# Patient Record
Sex: Female | Born: 1995 | Race: White | Hispanic: No | Marital: Single | Smoking: Current every day smoker
Health system: Southern US, Community
[De-identification: ages and names within clinical notes are randomized; demographics above are authoritative.]

## PROBLEM LIST (undated history)

## (undated) DIAGNOSIS — R519 Headache, unspecified: Secondary | ICD-10-CM

## (undated) DIAGNOSIS — J039 Acute tonsillitis, unspecified: Secondary | ICD-10-CM

## (undated) DIAGNOSIS — M549 Dorsalgia, unspecified: Secondary | ICD-10-CM

## (undated) DIAGNOSIS — G8929 Other chronic pain: Secondary | ICD-10-CM

## (undated) DIAGNOSIS — R51 Headache: Secondary | ICD-10-CM

## (undated) HISTORY — PX: WISDOM TOOTH EXTRACTION: SHX21

---
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
## 2015-07-14 ENCOUNTER — Encounter (HOSPITAL_COMMUNITY): Payer: Self-pay | Admitting: *Deleted

## 2015-07-14 ENCOUNTER — Emergency Department (HOSPITAL_COMMUNITY)
Admission: EM | Admit: 2015-07-14 | Discharge: 2015-07-14 | Disposition: A | Discharge status: Home / Self Care | Payer: Medicaid Other | Attending: Emergency Medicine | Admitting: Emergency Medicine

## 2015-07-14 DIAGNOSIS — Z79899 Other long term (current) drug therapy: Secondary | ICD-10-CM | POA: Diagnosis not present

## 2015-07-14 DIAGNOSIS — Z791 Long term (current) use of non-steroidal anti-inflammatories (NSAID): Secondary | ICD-10-CM | POA: Insufficient documentation

## 2015-07-14 DIAGNOSIS — G8929 Other chronic pain: Secondary | ICD-10-CM | POA: Insufficient documentation

## 2015-07-14 DIAGNOSIS — N939 Abnormal uterine and vaginal bleeding, unspecified: Secondary | ICD-10-CM | POA: Diagnosis present

## 2015-07-14 DIAGNOSIS — Z3202 Encounter for pregnancy test, result negative: Secondary | ICD-10-CM | POA: Diagnosis not present

## 2015-07-14 DIAGNOSIS — R350 Frequency of micturition: Secondary | ICD-10-CM | POA: Diagnosis not present

## 2015-07-14 LAB — COMPREHENSIVE METABOLIC PANEL
ALBUMIN: 3.7 g/dL (ref 3.5–5.0)
ALT: 18 U/L (ref 14–54)
AST: 21 U/L (ref 15–41)
Alkaline Phosphatase: 55 U/L (ref 38–126)
Anion gap: 13 (ref 5–15)
BUN: 10 mg/dL (ref 6–20)
CALCIUM: 9.5 mg/dL (ref 8.9–10.3)
CHLORIDE: 106 mmol/L (ref 101–111)
CO2: 22 mmol/L (ref 22–32)
Creatinine, Ser: 0.68 mg/dL (ref 0.44–1.00)
GFR calc Af Amer: >60 mL/min (ref >60)
GFR calc non Af Amer: >60 mL/min (ref >60)
GLUCOSE: 111 mg/dL — AB (ref 65–99)
POTASSIUM: 3.9 mmol/L (ref 3.5–5.1)
SODIUM: 141 mmol/L (ref 135–145)
TOTAL PROTEIN: 6.8 g/dL (ref 6.5–8.1)
Total Bilirubin: 0.7 mg/dL (ref 0.3–1.2)

## 2015-07-14 LAB — CBC WITH DIFFERENTIAL/PLATELET
BASOS ABS: 0 10*3/uL (ref 0.0–0.1)
BASOS PCT: 0 %
Eosinophils Absolute: 0.2 10*3/uL (ref 0.0–0.7)
Eosinophils Relative: 4 %
HEMATOCRIT: 41.4 % (ref 36.0–46.0)
HEMOGLOBIN: 13.5 g/dL (ref 12.0–15.0)
Lymphocytes Relative: 31 %
Lymphs Abs: 1.6 10*3/uL (ref 0.7–4.0)
MCH: 28 pg (ref 26.0–34.0)
MCHC: 32.6 g/dL (ref 30.0–36.0)
MCV: 85.9 fL (ref 78.0–100.0)
MONO ABS: 0.5 10*3/uL (ref 0.1–1.0)
Monocytes Relative: 10 %
NEUTROS ABS: 2.9 10*3/uL (ref 1.7–7.7)
NEUTROS PCT: 55 %
Platelets: 250 10*3/uL (ref 150–400)
RBC: 4.82 MIL/uL (ref 3.87–5.11)
RDW: 12.6 % (ref 11.5–15.5)
WBC: 5.3 10*3/uL (ref 4.0–10.5)

## 2015-07-14 LAB — HCG, QUANTITATIVE, PREGNANCY

## 2015-07-14 NOTE — ED Notes (Signed)
Pt unable to void at this time. 

## 2015-07-14 NOTE — ED Notes (Signed)
Pt presents via POV c/o vaginal bleeding.  Pt reports + pregnancy test Friday, LMP 4/1.  Reports very light bleeding.  A x 4, NAD.

## 2015-07-14 NOTE — ED Notes (Signed)
Pt ambulated to restroom in room. 

## 2015-07-14 NOTE — ED Provider Notes (Signed)
CSN: [id number]     Arrival date & time 07/14/15  4098 History   First MD Initiated Contact with Patient 07/14/15 [phone]     Chief Complaint  Patient presents with  . Vaginal Bleeding   HPI Comments: 20 year old G0P0 female who presents with vaginal bleeding since this morning. She states that she took a pregnancy test on Friday which was positive. LMP was beginning of April. She has never been pregnant before. She had only one episode of vaginal bleeding which she states was "light". She reports associated intermittent lower abdominal cramping and urinary frequency. Denies fever, chest pain, SOB, N/V/D, dysuria, vaginal discharge.  Patient is a 20 y.o. female presenting with vaginal bleeding.  Vaginal Bleeding Associated symptoms: abdominal pain   Associated symptoms: no dysuria, no fever, no nausea and no vaginal discharge     Past Medical History  Diagnosis Date  . Chronic headaches    History reviewed. No pertinent past surgical history. No family history on file. Social History  Substance Use Topics  . Smoking status: Never Smoker   . Smokeless tobacco: None  . Alcohol Use: No   OB History    No data available     Review of Systems  Constitutional: Negative for fever.  Gastrointestinal: Positive for abdominal pain. Negative for nausea, vomiting and diarrhea.  Genitourinary: Positive for frequency and vaginal bleeding. Negative for dysuria and vaginal discharge.  All other systems reviewed and are negative.   Allergies  Review of patient's allergies indicates no known allergies.  Home Medications   Prior to Admission medications   Medication Sig Start Date End Date Taking? Authorizing Provider  ibuprofen (ADVIL,MOTRIN) 800 MG tablet Take 1 tablet (800 mg total) by mouth 3 (three) times daily. 07/22/13   Graylon Good, PA-C  topiramate (TOPAMAX) 100 MG tablet Take 200 mg by mouth at bedtime.     Historical Provider, MD  traMADol (ULTRAM) 50 MG tablet Take 1 tablet (50  mg total) by mouth every 6 (six) hours as needed. 07/22/13   Adrian Blackwater Baker, PA-C   BP 113/60 mmHg  Pulse 85  Temp(Src) 98.9 F (37.2 C) (Oral)  Resp 18  SpO2 100%  LMP 05/29/2015   Physical Exam  Constitutional: She is oriented to person, place, and time. She appears well-developed and well-nourished. No distress.  HENT:  Head: Normocephalic and atraumatic.  Eyes: Conjunctivae are normal. Pupils are equal, round, and reactive to light. Right eye exhibits no discharge. Left eye exhibits no discharge. No scleral icterus.  Neck: Normal range of motion.  Cardiovascular: Normal rate and regular rhythm.  Exam reveals no gallop and no friction rub.   No murmur heard. Pulmonary/Chest: Effort normal and breath sounds normal. No respiratory distress. She has no wheezes. She has no rales. She exhibits no tenderness.  Abdominal: Soft. Bowel sounds are normal. She exhibits no distension and no mass. There is no tenderness. There is no rebound and no guarding.  Genitourinary:  Chaperone present during exam. No inguinal lymphadenopathy or inguinal hernia noted. Normal external genitalia. No pain with speculum insertion. Close cervical os with out any significant discharge noted. Moderate amount of blood in the vaginal vault and coming from cervix. Os with normal appearance without rash or lesions. On bimanual examination no adnexal tenderness or cervical motion tenderness.    Neurological: She is alert and oriented to person, place, and time.  Skin: Skin is warm and dry.  Psychiatric: She has a normal mood and affect.  ED Course  Procedures (including critical care time) Labs Review Labs Reviewed  COMPREHENSIVE METABOLIC PANEL - Abnormal; Notable for the following:    Glucose, Bld 111 (*)    All other components within normal limits  HCG, QUANTITATIVE, PREGNANCY  CBC WITH DIFFERENTIAL/PLATELET    Imaging Review No results found. I have personally reviewed and evaluated these images and  lab results as part of my medical decision-making.   EKG Interpretation None      MDM   Final diagnoses:  Vaginal bleeding   20 year old female who presents with vaginal bleeding in the setting of a positive home pregnancy test.  All vital signs were within normal limits and stable on the ER.  Physical exam is remarkable for a moderate amount of blood in the vaginal vault on pelvic exam. Abdominal exam was benign. CBC is unremarkable. CMP is unremarkable. Quantitative, pregnancy test is negative.  Patient was able to void while in the emergency room.  Strict return precautions given for fever, UTI, worsening abdominal pain.  Patient is NAD, non-toxic, with stable VS. Patient is informed of clinical course, understands medical decision making process, and agrees with plan. Opportunity for questions provided and all questions answered. Return precautions given.   Bethel BornKelly Marie Karnell Vanderloop, PA-C 07/14/15 1039  Melene Planan Floyd, DO 07/14/15 1044

## 2015-07-14 NOTE — ED Notes (Signed)
Pelvic cart set up at bedside  

## 2015-07-14 NOTE — Discharge Instructions (Signed)
You were seen and evaluated today for vaginal bleeding in the setting of a positive pregnancy test at home. Your pregnancy test here in the Emergency room was negative so it is most likely you are having your regular period. You were also reporting some urinary frequency but were unable to give a sample and elected to be discharged before providing the sample. If you start to develop a fever, symptoms of a UTI (burning when you pee, urine with an odor, peeing frequently) or worsening pain in your abdomen, please return to the ER for reevaluation.

## 2015-11-13 ENCOUNTER — Encounter (HOSPITAL_COMMUNITY): Payer: Self-pay | Admitting: Emergency Medicine

## 2015-11-13 ENCOUNTER — Emergency Department (HOSPITAL_COMMUNITY): Payer: Medicaid Other

## 2015-11-13 ENCOUNTER — Emergency Department (HOSPITAL_COMMUNITY)
Admission: EM | Admit: 2015-11-13 | Discharge: 2015-11-13 | Disposition: A | Discharge status: Home / Self Care | Payer: Medicaid Other | Attending: Emergency Medicine | Admitting: Emergency Medicine

## 2015-11-13 DIAGNOSIS — Y999 Unspecified external cause status: Secondary | ICD-10-CM | POA: Insufficient documentation

## 2015-11-13 DIAGNOSIS — Y939 Activity, unspecified: Secondary | ICD-10-CM | POA: Diagnosis not present

## 2015-11-13 DIAGNOSIS — X501XXA Overexertion from prolonged static or awkward postures, initial encounter: Secondary | ICD-10-CM | POA: Diagnosis not present

## 2015-11-13 DIAGNOSIS — Y929 Unspecified place or not applicable: Secondary | ICD-10-CM | POA: Diagnosis not present

## 2015-11-13 DIAGNOSIS — R52 Pain, unspecified: Secondary | ICD-10-CM

## 2015-11-13 DIAGNOSIS — S93602A Unspecified sprain of left foot, initial encounter: Secondary | ICD-10-CM

## 2015-11-13 DIAGNOSIS — S9032XA Contusion of left foot, initial encounter: Secondary | ICD-10-CM | POA: Insufficient documentation

## 2015-11-13 DIAGNOSIS — S93402A Sprain of unspecified ligament of left ankle, initial encounter: Secondary | ICD-10-CM | POA: Insufficient documentation

## 2015-11-13 DIAGNOSIS — S99912A Unspecified injury of left ankle, initial encounter: Secondary | ICD-10-CM | POA: Diagnosis present

## 2015-11-13 MED ORDER — IBUPROFEN 800 MG PO TABS
800.0000 mg | ORAL_TABLET | Freq: Three times a day (TID) | ORAL | 0 refills | Status: DC
Start: 2015-11-13 — End: 2016-07-18

## 2015-11-13 MED ORDER — IBUPROFEN 400 MG PO TABS
ORAL_TABLET | ORAL | Status: AC
  Filled 2015-11-13: qty 1

## 2015-11-13 MED ORDER — IBUPROFEN 400 MG PO TABS
400.0000 mg | ORAL_TABLET | Freq: Once | ORAL | Status: AC
  Administered 2015-11-13: 400 mg via ORAL

## 2015-11-13 NOTE — ED Triage Notes (Signed)
Pt arrives with c/o L sided ankle pain after "stepping in a hole." Able to ambulate a bit. Denies fall. CMS intact, no swelling, no deformity. Chronic swelling to R foot.

## 2015-11-13 NOTE — Discharge Instructions (Signed)

## 2015-11-13 NOTE — ED Provider Notes (Signed)
[facility]-EMERGENCY DEPT Provider Note   CSN: [id number] Arrival date & time: 11/13/15  0038     History   Chief Complaint Chief Complaint  Patient presents with  . Ankle Pain    HPI Brenda Hale is a 20 y.o. female with a hx of Chronic headaches presents to the Emergency Department complaining of acute, persistent left ankle pain after stumbling into a hole and twisting her foot and ankle. Patient reports this occurred approximately 6 PM tonight. She did not fall or hit her head. No loss of consciousness. She reports at this time her pain has been burning in nature and persistent. She reports she has been able to weight-bear. No treatments prior to arrival. She was given Motrin in the waiting room which did help. No numbness or tingling, no weakness. No previous surgeries to the foot or ankle.    The history is provided by the patient and medical records. No language interpreter was used.    Past Medical History:  Diagnosis Date  . Chronic headaches     There are no active problems to display for this patient.   History reviewed. No pertinent surgical history.  OB History    No data available       Home Medications    Prior to Admission medications   Medication Sig Start Date End Date Taking? Authorizing Provider  acetaminophen (TYLENOL) 325 MG tablet Take 650 mg by mouth every 6 (six) hours as needed for mild pain.    Historical Provider, MD  ibuprofen (ADVIL,MOTRIN) 800 MG tablet Take 1 tablet (800 mg total) by mouth 3 (three) times daily. Patient not taking: Reported on 07/14/2015 07/22/13   Graylon GoodZachary H Baker, PA-C  traMADol (ULTRAM) 50 MG tablet Take 1 tablet (50 mg total) by mouth every 6 (six) hours as needed. Patient not taking: Reported on 07/14/2015 07/22/13   Graylon GoodZachary H Baker, PA-C    Family History No family history on file.  Social History Social History  Substance Use Topics  . Smoking status: Never Smoker  . Smokeless tobacco: Never Used  . Alcohol  use Yes     Allergies   Review of patient's allergies indicates no known allergies.   Review of Systems Review of Systems  Musculoskeletal: Positive for arthralgias and joint swelling.  All other systems reviewed and are negative.    Physical Exam Updated Vital Signs BP 107/56   Pulse 60   Temp 98.3 F (36.8 C) (Oral)   Resp 16   Ht 4\' 10"  (1.473 m)   Wt 63.5 kg   LMP 11/12/2015 (Exact Date)   SpO2 99%   BMI 29.26 kg/m   Physical Exam  Constitutional: She appears well-developed and well-nourished. No distress.  HENT:  Head: Normocephalic and atraumatic.  Eyes: Conjunctivae are normal.  Neck: Normal range of motion.  Cardiovascular: Normal rate, regular rhythm and intact distal pulses.   Capillary refill < 3 sec  Pulmonary/Chest: Effort normal and breath sounds normal.  Musculoskeletal: She exhibits tenderness. She exhibits no edema.  Left ankle: Full range of motion, mild tenderness to palpation of the lateral malleolus, no swelling or ecchymosis noted  Left foot: Full range of motion of all toes, sensation intact, strength 5/5 with dorsiflexion and plantar flexion. Tenderness to palpation over the fifth metatarsal with some swelling and ecchymosis.  No open wounds.  Neurological: She is alert. Coordination normal.  Skin: Skin is warm and dry. She is not diaphoretic.  No tenting of the skin  Psychiatric: She  has a normal mood and affect.  Nursing note and vitals reviewed.    ED Treatments / Results  Labs (all labs ordered are listed, but only abnormal results are displayed) Labs Reviewed - No data to display  EKG  EKG Interpretation None       Radiology Dg Ankle Complete Left  Result Date: 11/13/2015 CLINICAL DATA:  Stepped in hole, with left lateral ankle and foot pain. Initial encounter. EXAM: LEFT ANKLE COMPLETE - 3+ VIEW COMPARISON:  Left ankle radiographs performed 05/10/2008 FINDINGS: There is no evidence of fracture or dislocation. The ankle  mortise is intact; the interosseous space is within normal limits. No talar tilt or subluxation is seen. The joint spaces are preserved. No significant soft tissue abnormalities are seen. IMPRESSION: No evidence of fracture or dislocation. Electronically Signed   By: Roanna Raider M.D.   On: 11/13/2015 01:48   Dg Foot Complete Left  Result Date: 11/13/2015 CLINICAL DATA:  Stepped in hole, with left lateral ankle and foot pain. Initial encounter. EXAM: LEFT FOOT - COMPLETE 3+ VIEW COMPARISON:  Left ankle radiographs performed 05/10/2008 FINDINGS: There is no evidence of fracture or dislocation. The joint spaces are preserved. There is no evidence of talar subluxation; the subtalar joint is unremarkable in appearance. A bipartite medial sesamoid of the first toe is noted. No significant soft tissue abnormalities are seen. IMPRESSION: 1. No evidence of fracture or dislocation. 2. Bipartite medial sesamoid of the first toe. Electronically Signed   By: Roanna Raider M.D.   On: 11/13/2015 01:50    Procedures Procedures (including critical care time)  Medications Ordered in ED Medications  ibuprofen (ADVIL,MOTRIN) 400 MG tablet (not administered)  ibuprofen (ADVIL,MOTRIN) tablet 400 mg (400 mg Oral Given 11/13/15 0059)     Initial Impression / Assessment and Plan / ED Course  I have reviewed the triage vital signs and the nursing notes.  Pertinent labs & imaging results that were available during my care of the patient were reviewed by me and considered in my medical decision making (see chart for details).  Clinical Course  Value Comment By Time  DG Foot Complete Left No acute abnormalities Dierdre Forth, PA-C 09/16 0529  DG Ankle Complete Left No acute abnormalities Dierdre Forth, PA-C 09/16 0530  BP: 107/56 VSS Dierdre Forth, PA-C 09/16 0530    Patient X-Ray negative for obvious fracture or dislocation. Pain managed in ED. Pt advised to follow up with orthopedics if  symptoms persist for possibility of missed fracture diagnosis. Patient given brace while in ED, conservative therapy recommended and discussed. Patient will be dc home & is agreeable with above plan.  Final Clinical Impressions(s) / ED Diagnoses   Final diagnoses:  Pain    New Prescriptions New Prescriptions   No medications on file     Dierdre Forth, PA-C 11/13/15 1610    April Palumbo, MD 11/13/15 [phone]

## 2016-03-15 ENCOUNTER — Encounter (HOSPITAL_COMMUNITY): Payer: Self-pay | Admitting: *Deleted

## 2016-03-15 ENCOUNTER — Emergency Department (HOSPITAL_COMMUNITY): Payer: Medicaid Other

## 2016-03-15 ENCOUNTER — Inpatient Hospital Stay (HOSPITAL_COMMUNITY)
Admission: EM | Admit: 2016-03-15 | Discharge: 2016-03-17 | DRG: 863 | Disposition: A | Discharge status: Home Health | Payer: Medicaid Other | Attending: Internal Medicine | Admitting: Internal Medicine

## 2016-03-15 DIAGNOSIS — F1010 Alcohol abuse, uncomplicated: Secondary | ICD-10-CM | POA: Diagnosis present

## 2016-03-15 DIAGNOSIS — Y838 Other surgical procedures as the cause of abnormal reaction of the patient, or of later complication, without mention of misadventure at the time of the procedure: Secondary | ICD-10-CM | POA: Diagnosis present

## 2016-03-15 DIAGNOSIS — M272 Inflammatory conditions of jaws: Secondary | ICD-10-CM | POA: Diagnosis present

## 2016-03-15 DIAGNOSIS — F17210 Nicotine dependence, cigarettes, uncomplicated: Secondary | ICD-10-CM | POA: Diagnosis present

## 2016-03-15 DIAGNOSIS — R1310 Dysphagia, unspecified: Secondary | ICD-10-CM | POA: Diagnosis present

## 2016-03-15 DIAGNOSIS — Z720 Tobacco use: Secondary | ICD-10-CM | POA: Diagnosis present

## 2016-03-15 DIAGNOSIS — T814XXA Infection following a procedure, initial encounter: Principal | ICD-10-CM | POA: Diagnosis present

## 2016-03-15 LAB — BASIC METABOLIC PANEL
ANION GAP: 11 (ref 5–15)
BUN: 6 mg/dL (ref 6–20)
CHLORIDE: 106 mmol/L (ref 101–111)
CO2: 21 mmol/L — AB (ref 22–32)
CREATININE: 0.82 mg/dL (ref 0.44–1.00)
Calcium: 9.1 mg/dL (ref 8.9–10.3)
GFR calc Af Amer: >60 mL/min (ref >60)
GFR calc non Af Amer: >60 mL/min (ref >60)
Glucose, Bld: 112 mg/dL — ABNORMAL HIGH (ref 65–99)
POTASSIUM: 3.3 mmol/L — AB (ref 3.5–5.1)
Sodium: 138 mmol/L (ref 135–145)

## 2016-03-15 LAB — CBC WITH DIFFERENTIAL/PLATELET
BASOS PCT: 0 %
Basophils Absolute: 0 10*3/uL (ref 0.0–0.1)
Eosinophils Absolute: 0 10*3/uL (ref 0.0–0.7)
Eosinophils Relative: 0 %
HEMATOCRIT: 36 % (ref 36.0–46.0)
HEMOGLOBIN: 11.8 g/dL — AB (ref 12.0–15.0)
LYMPHS ABS: 1.3 10*3/uL (ref 0.7–4.0)
Lymphocytes Relative: 13 %
MCH: 27.6 pg (ref 26.0–34.0)
MCHC: 32.8 g/dL (ref 30.0–36.0)
MCV: 84.3 fL (ref 78.0–100.0)
MONOS PCT: 7 %
Monocytes Absolute: 0.7 10*3/uL (ref 0.1–1.0)
NEUTROS ABS: 7.9 10*3/uL — AB (ref 1.7–7.7)
NEUTROS PCT: 80 %
Platelets: 208 10*3/uL (ref 150–400)
RBC: 4.27 MIL/uL (ref 3.87–5.11)
RDW: 13.1 % (ref 11.5–15.5)
WBC: 10 10*3/uL (ref 4.0–10.5)

## 2016-03-15 LAB — I-STAT BETA HCG BLOOD, ED (MC, WL, AP ONLY): I-stat hCG, quantitative: <5 m[IU]/mL (ref <5)

## 2016-03-15 LAB — I-STAT CG4 LACTIC ACID, ED: LACTIC ACID, VENOUS: 1.12 mmol/L (ref 0.5–1.9)

## 2016-03-15 MED ORDER — ACETAMINOPHEN 500 MG PO TABS
1000.0000 mg | ORAL_TABLET | Freq: Once | ORAL | Status: AC
  Administered 2016-03-15: 1000 mg via ORAL
  Filled 2016-03-15: qty 2

## 2016-03-15 MED ORDER — ENOXAPARIN SODIUM 40 MG/0.4ML ~~LOC~~ SOLN
40.0000 mg | SUBCUTANEOUS | Status: DC
  Administered 2016-03-15 – 2016-03-16 (×2): 40 mg via SUBCUTANEOUS
  Filled 2016-03-15 (×3): qty 0.4

## 2016-03-15 MED ORDER — ONDANSETRON HCL 4 MG PO TABS: 4.0000 mg | ORAL_TABLET | Freq: Four times a day (QID) | ORAL | Status: DC | PRN

## 2016-03-15 MED ORDER — IOPAMIDOL (ISOVUE-300) INJECTION 61%
INTRAVENOUS | Status: AC
  Administered 2016-03-15: 75 mL via INTRAVENOUS
  Filled 2016-03-15: qty 75

## 2016-03-15 MED ORDER — DEXAMETHASONE SODIUM PHOSPHATE 10 MG/ML IJ SOLN
10.0000 mg | Freq: Four times a day (QID) | INTRAMUSCULAR | Status: DC
  Administered 2016-03-15 – 2016-03-17 (×9): 10 mg via INTRAVENOUS
  Filled 2016-03-15 (×9): qty 1

## 2016-03-15 MED ORDER — ONDANSETRON HCL 4 MG/2ML IJ SOLN: 4.0000 mg | Freq: Four times a day (QID) | INTRAMUSCULAR | Status: DC | PRN

## 2016-03-15 MED ORDER — FENTANYL CITRATE (PF) 100 MCG/2ML IJ SOLN
50.0000 ug | Freq: Once | INTRAMUSCULAR | Status: AC
  Administered 2016-03-15: 50 ug via INTRAVENOUS
  Filled 2016-03-15: qty 2

## 2016-03-15 MED ORDER — SODIUM CHLORIDE 0.9 % IV BOLUS (SEPSIS)
1000.0000 mL | Freq: Once | INTRAVENOUS | Status: AC
  Administered 2016-03-15: 1000 mL via INTRAVENOUS

## 2016-03-15 MED ORDER — CLINDAMYCIN PHOSPHATE 900 MG/50ML IV SOLN
900.0000 mg | Freq: Three times a day (TID) | INTRAVENOUS | Status: DC
  Administered 2016-03-15 – 2016-03-17 (×6): 900 mg via INTRAVENOUS
  Filled 2016-03-15 (×7): qty 50

## 2016-03-15 MED ORDER — SODIUM CHLORIDE 0.9 % IV SOLN
3.0000 g | Freq: Three times a day (TID) | INTRAVENOUS | Status: DC
  Administered 2016-03-15: 3 g via INTRAVENOUS
  Filled 2016-03-15 (×2): qty 3

## 2016-03-15 MED ORDER — KETOROLAC TROMETHAMINE 30 MG/ML IJ SOLN
30.0000 mg | Freq: Four times a day (QID) | INTRAMUSCULAR | Status: DC | PRN
  Administered 2016-03-15 – 2016-03-17 (×5): 30 mg via INTRAVENOUS
  Filled 2016-03-15 (×6): qty 1

## 2016-03-15 MED ORDER — SODIUM CHLORIDE 0.9 % IV SOLN
INTRAVENOUS | Status: AC
  Administered 2016-03-15: 09:00:00 via INTRAVENOUS

## 2016-03-15 MED ORDER — MORPHINE SULFATE (PF) 4 MG/ML IV SOLN
2.0000 mg | Freq: Once | INTRAVENOUS | Status: AC
  Administered 2016-03-15: 2 mg via INTRAVENOUS
  Filled 2016-03-15: qty 1

## 2016-03-15 MED ORDER — CLINDAMYCIN PHOSPHATE 900 MG/50ML IV SOLN
900.0000 mg | Freq: Once | INTRAVENOUS | Status: AC
  Administered 2016-03-15: 900 mg via INTRAVENOUS
  Filled 2016-03-15: qty 50

## 2016-03-15 MED ORDER — ONDANSETRON HCL 4 MG/2ML IJ SOLN
4.0000 mg | Freq: Once | INTRAMUSCULAR | Status: AC
Start: 2016-03-15 — End: 2016-03-15
  Administered 2016-03-15: 4 mg via INTRAVENOUS
  Filled 2016-03-15: qty 2

## 2016-03-15 NOTE — Consult Note (Signed)
Reason for Consult:left tooth extraction Referring Physician: er  Brenda Hale is an 21 y.o. female.  HPI: patient with recent wisdom tooth extraction about 4 days ago. She states it's hurting about the same as it did right after the surgery. This was performed at an outside hospital. She has had some increased swelling in the left neck. She has slight amount of trismus. She is not having any stridor but feels like there is a feeling of shortness of breath. She was placed on clindamycin as an outpatient.  Past Medical History:  Diagnosis Date  . Chronic headaches     Past Surgical History:  Procedure Laterality Date  . WISDOM TOOTH EXTRACTION      History reviewed. No pertinent family history.  Social History:  reports that she has never smoked. She has never used smokeless tobacco. She reports that she drinks alcohol. She reports that she uses drugs, including Marijuana.  Allergies: No Known Allergies  Medications: I have reviewed the patient's current medications.  Results for orders placed or performed during the hospital encounter of 03/15/16 (from the past 48 hour(s))  CBC with Differential/Platelet     Status: Abnormal   Collection Time: 03/15/16  5:18 AM  Result Value Ref Range   WBC 10.0 4.0 - 10.5 K/uL   RBC 4.27 3.87 - 5.11 MIL/uL   Hemoglobin 11.8 (L) 12.0 - 15.0 g/dL   HCT 36.0 36.0 - 46.0 %   MCV 84.3 78.0 - 100.0 fL   MCH 27.6 26.0 - 34.0 pg   MCHC 32.8 30.0 - 36.0 g/dL   RDW 13.1 11.5 - 15.5 %   Platelets 208 150 - 400 K/uL   Neutrophils Relative % 80 %   Neutro Abs 7.9 (H) 1.7 - 7.7 K/uL   Lymphocytes Relative 13 %   Lymphs Abs 1.3 0.7 - 4.0 K/uL   Monocytes Relative 7 %   Monocytes Absolute 0.7 0.1 - 1.0 K/uL   Eosinophils Relative 0 %   Eosinophils Absolute 0.0 0.0 - 0.7 K/uL   Basophils Relative 0 %   Basophils Absolute 0.0 0.0 - 0.1 K/uL  Basic metabolic panel     Status: Abnormal   Collection Time: 03/15/16  5:18 AM  Result Value Ref Range    Sodium 138 135 - 145 mmol/L   Potassium 3.3 (L) 3.5 - 5.1 mmol/L   Chloride 106 101 - 111 mmol/L   CO2 21 (L) 22 - 32 mmol/L   Glucose, Bld 112 (H) 65 - 99 mg/dL   BUN 6 6 - 20 mg/dL   Creatinine, Ser 0.82 0.44 - 1.00 mg/dL   Calcium 9.1 8.9 - 10.3 mg/dL   GFR calc non Af Amer >60 >60 mL/min   GFR calc Af Amer >60 >60 mL/min    Comment: (NOTE) The eGFR has been calculated using the CKD EPI equation. This calculation has not been validated in all clinical situations. eGFR's persistently <60 mL/min signify possible Chronic Kidney Disease.    Anion gap 11 5 - 15  I-Stat beta hCG blood, ED ([facility], [facility], [facility] only)     Status: None   Collection Time: 03/15/16  5:25 AM  Result Value Ref Range   I-stat hCG, quantitative <5.0 <5 mIU/mL   Comment 3            Comment:   GEST. AGE      CONC.  (mIU/mL)   <=1 WEEK        5 - 50  2 WEEKS       50 - 500     3 WEEKS       100 - 10,000     4 WEEKS     1,000 - 30,000        FEMALE AND NON-PREGNANT FEMALE:     LESS THAN 5 mIU/mL   I-Stat CG4 Lactic Acid, ED     Status: None   Collection Time: 03/15/16  5:28 AM  Result Value Ref Range   Lactic Acid, Venous 1.12 0.5 - 1.9 mmol/L    Ct Soft Tissue Neck W Contrast  Result Date: 03/15/2016 CLINICAL DATA:  21 year old female status post wisdom tooth extraction 4 days ago with left face and neck swelling. Initial encounter. EXAM: CT NECK WITH CONTRAST TECHNIQUE: Multidetector CT imaging of the neck was performed using the standard protocol following the bolus administration of intravenous contrast. CONTRAST:  62m ISOVUE-300 IOPAMIDOL (ISOVUE-300) INJECTION 61% COMPARISON:  Neck CT 07/10/2005. FINDINGS: Pharynx and larynx: Negative larynx except for left supraglottic laryngeal wall edema mildly affecting the left aryepiglottic fold. There is also mild epiglottic thickening. Symmetric appearing adenoid hypertrophy. Palatine tonsillar enlargement, greater on the left. No tonsillar abscess. Lingual tonsillar  hypertrophy. Generalized left parapharyngeal space inflammation with edema tracking along the left lateral wall of the pharynx. No retropharyngeal effusion or edema. Negative right parapharyngeal space. Salivary glands: Generalized inflammation in the left submandibular space, although both submandibular glands are diminutive or absent. Soft tissue stranding and edema mildly involves the posterolateral aspect of the left sublingual space. The central sublingual space remains normal. The inflammatory changes cross midline in the submental region and there is left greater than right thickening of the platysma and perimandibular inflammatory stranding. Reactive lymphadenopathy is described below. The left masticator space also appears inflamed. There is no organized or drainable fluid collection. No sialolithiasis identified. Both parotid glands appear relatively spared. Thyroid: Negative. Lymph nodes: Reactive appearing left greater than right level 1 and level 2 lymphadenopathy. Mild asymmetric left lymph node hyper enhancement. No cystic or necrotic nodes. Vascular: Major vascular structures in the neck and at the skullbase are patent including the left internal jugular vein. Limited intracranial: Negative. Visualized orbits: Negative. Mastoids and visualized paranasal sinuses: Trace bilateral maxillary sinus mucosal thickening. Other visible paranasal sinuses, tympanic cavities, and mastoids are clear. Skeleton: Gas and fluid within the left mandible wisdom tooth extraction site (sagittal image 68). No associated mandible fracture. No mandible bone erosion to suggest osteomyelitis. The remaining wisdom teeth remain in place. No other acute dental finding. Other osseous structures are normal. Upper chest: Negative lung apices. Negative visualized superior mediastinum. No axillary lymphadenopathy. IMPRESSION: 1. Acute trans spatial inflammation with epicenter at the left submandibular and masticator spaces near the  left mandible wisdom tooth extraction site which contains a small volume of fluid and gas. 2. Trans-spatial edema and inflammation in the deep neck, especially the left parapharyngeal space and left lateral walls of the pharynx and larynx. Mild involvement also of the left sublingual space. 3. However, there is no abscess or drainable fluid collection. 4. Left greater than right reactive level 1 and 2 lymphadenopathy with no suppurative nodes. 5. Both submandibular glands appear chronically diminutive or absent. Electronically Signed   By: HGenevie AnnM.D.   On: 03/15/2016 07:26    ROS Blood pressure 111/69, pulse 116, temperature 101.1 F (38.4 C), temperature source Oral, resp. rate 23, SpO2 98 %. Physical Exam  Constitutional: She appears well-developed and well-nourished.  HENT:  Head: Normocephalic.  Oc/op- there is trismus but can see the uvula which looks normal. There is no tongue swelling and good movement of the tongue. The floor of mouth is slightly edematous but not hard. The left neck is firm and painful to palpation with swelling. No stridor. No change in voice.   Eyes: Conjunctivae are normal. Pupils are equal, round, and reactive to light.    Assessment/Plan: Left neck/parapharyngeal space infection-this undoubtably is secondary to the extraction site. She has a very early evidence of Ludwig angina with very slight swelling in the floor mouth which is not hard and there is no change in the tongue appearance or elevation. Given this she definitely needs intravenous antibiotics and very careful observation. Right now there does not appear to be any abscess that needs to be drained but if she does not respond quickly then she may need a intervention into the submandibular and masseter space. She needs an oral surgeon because this is an oral surgery problem with an oral surgery procedure. She will be admitted by the medicine service for in-hospital care and hopefully a consultation can be  obtained with oral surgery. If she's not responding to medical therapy, the neck issue can be and will be addressed by me.  Brenda Hale 03/15/2016, 12:40 PM

## 2016-03-15 NOTE — H&P (Signed)
Date: 03/15/2016               Patient Name:  Brenda Hale MRN: [id number]  DOB: 1996/02/06 Age / Sex: 21 y.o., female   PCP: Pcp Not In System         Medical Service: Internal Medicine Teaching Service         Attending Physician: Dr. Inez Catalina, MD    First Contact: Dr. Vincente Liberty Pager: [phone]  Second Contact: Dr. 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 4 days ago, she underwent left wisdom tooth extraction at the [facility]. Since then, she developed throat swelling, difficulty breathing,  difficulty swallowing mostly solids but some liquids and subsequent vomiting up of food with some pus and mucous. Associated with her poor oral intake is nausea, some left-sided abdominal cramping, dizziness, and weakness, and last night she developed fever and chills, so she presented to the emergency department for further evaluation. She has undergone prior dental work in the past without any complications and was hospitalized in the 2nd or 3rd grade for a week for "swollen lymph nodes." She has no known allergies to any antibiotics or any other regular medications for any chronic medical conditions. Her family history is without heart or lung disease. Outside of her occupation, she does smoke a pack every 2 weeks for "a few years," drinks 3 bottles of [employer] hard liquor on occasion, and smokes marijuana occasionally. Otherwise, she denies any chest pain, headache, syncope, changes in bowel movements, rash, dysuria, increased urinary frequency.  In the emergency department, CT neck with contrast was notable for acute inflammatory changes around the epicenter of the left  submandibular and masticator spaces near the left mandible wisdom tooth extraction site; inflammatory changes in the deep neck, specifically the left parapharyngeal space and left lateral walls of the pharynx and larynx; reactive lymphadenopathy; but no abscess or drainable fluid collection. She was given 1 L normal saline bolus, fentanyl 500 g, morphine 2 mg IV, clindamycin IV.   Meds:  No home medications.  Allergies: Allergies as of 03/15/2016  . (No Known Allergies)   Past medical history: No prior history of diabetes, hypertension. As noted in the HPI.  Family History: As noted in the HPI.  Social History: In a relationship with her boyfriend. Otherwise, as noted in the HPI.  Review of Systems: A complete ROS was negative except as per HPI.   Physical Exam: Blood pressure (!) 113/51, pulse (!) 125, temperature 99.5 F (37.5 C), temperature source Oral, resp. rate 20, SpO2 97 %.  Physical Exam  Constitutional: She is oriented to person, place, and time. No distress.  HENT:  Head: Normocephalic and atraumatic.  Unable to visualize posterior oropharynx and lower teeth bilaterally due to trismus. Metal cap noted on upper right tooth. Dry mucous membranes. Malodorous breath.  Eyes: Conjunctivae are normal. No scleral icterus.  Neck: No JVD present. No tracheal deviation present.  Swelling noted of the face, left greater than right, in the submandibular area.  Cardiovascular: Exam reveals no gallop and no friction rub.   No murmur heard. Tachycardic  Pulmonary/Chest: Effort normal and breath sounds normal. No respiratory distress.  Abdominal: Soft. Bowel sounds  are normal.  Neurological: She is alert and oriented to person, place, and time.  Skin: Skin is warm and dry. She is not diaphoretic.   EKG: I reviewed and compared with none prior. Sinus tachycardia, normal axis. Q waves in II, III, V6, related to tacychardia?  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  Odontogenic infection with deep neck space involvement: As noted in CT imaging findings. She is not in respiratory distress on exam and without oxygen requirement which is reassuring though has trismus and dysphagia. Though she does have fever and chills, she is without leukocytosis or other signs and symptoms suggestive of a systemic infection, and her tachycardia is appropriate response to pain and fever. Spoke with ENT physician on call, Dr. Jearld FentonByers, who recommended IV fluids and antibiotics. -Start ampicillin/sulbactam IV per Pharmacy as she does not have a penicillin allergy for which clindamycin would be appropriate -Follow-up ENT recommendations -Start ketorolac 30 mg IV every 6 hours as needed for pain with breakthrough dose of morphine 2mg  IV if needed -Follow-up blood cultures -Start normal saline at 100 mL/hour until she is able to tolerate adequate oral intake. She had some blood pressure readings with systolic BP in the 90s which is likely from the IV opiates as opposed to systemic infection.  Abnormal EKG: Q waves noted above may be from sinus tachycardia. No chest pain on admission or risk factors for premature coronary artery disease. -Repeat EKG when no longer tachycardic  Polysubstance abuse: Tobacco and alcohol abuse as noted above. Recommend counseling prior to discharge given the association with dental disease. -Check HIV  #FEN:  -Diet: Clear liquid  #DVT prophylaxis: Lovenox  #CODE STATUS: FULL CODE -Defer to boyfriend Brenda HuaDavid [[id number]][[id number]] if patients lacks decision-making capacity -Confirmed with patient on admission  Dispo: Admit patient to Observation with expected length of stay less than 2 midnights.  Signed: Beather Arbourushil V Carizma Dunsworth,  MD 03/15/2016, 9:56 AM  Pager: [phone]

## 2016-03-15 NOTE — ED Notes (Signed)
Clear liquid tray ordered for patient.  ?

## 2016-03-15 NOTE — ED Notes (Signed)
Pt taken to CT.

## 2016-03-15 NOTE — Progress Notes (Signed)
[facility] and discussed with both Oral & maxillofacial surgeon, and ENT there. After reviewing the case, they believe that patient does not need to be transferred, and they have waiting list there too.   Discussed that while she has early signs of Ludwig, there is no focal abscess or fluid collection which requires any surgery at this point. She has already been evaluated by Dr Jearld FentonByers in ENT here, which we do have on-call, and from an airway stand point she is stable  They recommended switching the antibiotic to Clindamycin 900 mg IV TID , and adding Decadron 10 mg q6 hours   We reevaluated the patient at 4 PM- she was able to converse, and I was able to visualize the back of her throat, though edematous.    Plan -switching the antibiotic to Clindamycin 900 mg IV TID  -adding Decadron 10 mg q6 hours -will continue to monitor her for any signs of airway compromise- explained to the patient signs of airway compromise including sudden difficulty breathing, swallowing or talking.  -ENT here following.

## 2016-03-15 NOTE — Progress Notes (Signed)
Brenda BundeCheyenne Hale is a 21 y.o. female patient admitted from ED awake, alert - oriented  X 4 - no acute distress noted.  VSS - Blood pressure (!) 106/54, pulse 98, temperature 98.5 F (36.9 C), resp. rate 20, SpO2 100 %.    IV in place, occlusive dsg intact without redness.  Orientation to room, and floor completed with information packet given to patient/family.  Patient declined safety video at this time.  Admission INP armband ID verified with patient/family, and in place.   SR up x 2, fall assessment complete, with patient and family able to verbalize understanding of risk associated with falls, and verbalized understanding to call nsg before up out of bed.  Call light within reach, patient able to voice, and demonstrate understanding.  Skin, clean-dry- intact without evidence of bruising, or skin tears.   No evidence of skin break down noted on exam.     Will cont to eval and treat per MD orders.  Eligah EastErin M Sheree Lalla, RN 03/15/2016 4:31 PM

## 2016-03-15 NOTE — ED Notes (Signed)
Patient taken to bathroom via wheelchair, visitor in tow

## 2016-03-15 NOTE — ED Triage Notes (Signed)
Patient states she had her left bottom wisdom tooth removed on Sat. C/o swelling in left lower jaw and increased pain. Tonight states she was having problems breathing. Swelling goes down into throat

## 2016-03-15 NOTE — ED Provider Notes (Signed)
[facility]-EMERGENCY DEPT Provider Note   CSN: [id number] Arrival date & time: 03/15/16  0442     History   Chief Complaint Chief Complaint  Patient presents with  . Abscess    HPI Brenda Hale is a 21 y.o. female.  Patient presents to the emergency department for evaluation of pain and swelling after was the tooth removal. Patient had her wisdom tooth removed on the left lower jaw 3 days ago. She has been having persistent pain since the procedure. Initially she did not think much about it because she was told she had would have swelling, but last night she started to have increased pain and swelling and developed fever and chills.      Past Medical History:  Diagnosis Date  . Chronic headaches     There are no active problems to display for this patient.   Past Surgical History:  Procedure Laterality Date  . WISDOM TOOTH EXTRACTION      OB History    No data available       Home Medications    Prior to Admission medications   Medication Sig Start Date End Date Taking? Authorizing Provider  HYDROcodone-acetaminophen (NORCO/VICODIN) 5-325 MG tablet Take 1 tablet by mouth every 6 (six) hours as needed for moderate pain.   Yes Historical Provider, MD  ibuprofen (ADVIL,MOTRIN) 800 MG tablet Take 1 tablet (800 mg total) by mouth 3 (three) times daily. 11/13/15  Yes Hannah Muthersbaugh, PA-C    Family History History reviewed. No pertinent family history.  Social History Social History  Substance Use Topics  . Smoking status: Never Smoker  . Smokeless tobacco: Never Used  . Alcohol use Yes     Allergies   Patient has no known allergies.   Review of Systems Review of Systems  Constitutional: Positive for chills and fever.  HENT: Positive for dental problem and facial swelling.   All other systems reviewed and are negative.    Physical Exam Updated Vital Signs BP (!) 97/53   Pulse (!) 127   Temp 102.2 F (39 C) (Oral)   Resp 20   SpO2 98%    Physical Exam  Constitutional: She is oriented to person, place, and time. She appears well-developed and well-nourished. No distress.  HENT:  Head: Normocephalic and atraumatic.  Right Ear: Hearing normal.  Left Ear: Hearing normal.  Nose: Nose normal.  Mouth/Throat: Oropharynx is clear and moist and mucous membranes are normal.  Eyes: Conjunctivae and EOM are normal. Pupils are equal, round, and reactive to light.  Neck: Normal range of motion. Neck supple.  tender tender fullness and swelling left submandibular region and upper anterior neck  Cardiovascular: Regular rhythm, S1 normal and S2 normal.  Exam reveals no gallop and no friction rub.   No murmur heard. Pulmonary/Chest: Effort normal and breath sounds normal. No respiratory distress. She exhibits no tenderness.  Abdominal: Soft. Normal appearance and bowel sounds are normal. There is no hepatosplenomegaly. There is no tenderness. There is no rebound, no guarding, no tenderness at McBurney's point and negative Murphy's sign. No hernia.  Musculoskeletal: Normal range of motion.  Neurological: She is alert and oriented to person, place, and time. She has normal strength. No cranial nerve deficit or sensory deficit. Coordination normal. GCS eye subscore is 4. GCS verbal subscore is 5. GCS motor subscore is 6.  Skin: Skin is warm, dry and intact. No rash noted. No cyanosis.  Psychiatric: She has a normal mood and affect. Her speech is normal and  behavior is normal. Thought content normal.  Nursing note and vitals reviewed.    ED Treatments / Results  Labs (all labs ordered are listed, but only abnormal results are displayed) Labs Reviewed  CBC WITH DIFFERENTIAL/PLATELET - Abnormal; Notable for the following:       Result Value   Hemoglobin 11.8 (*)    Neutro Abs 7.9 (*)    All other components within normal limits  BASIC METABOLIC PANEL - Abnormal; Notable for the following:    Potassium 3.3 (*)    CO2 21 (*)    Glucose,  Bld 112 (*)    All other components within normal limits  CULTURE, BLOOD (ROUTINE X 2)  CULTURE, BLOOD (ROUTINE X 2)  I-STAT BETA HCG BLOOD, ED ([facility], [facility], [facility] ONLY)  I-STAT CG4 LACTIC ACID, ED    EKG  EKG Interpretation  Date/Time:  Wednesday March 15 2016 05:08:41 EST Ventricular Rate:  147 PR Interval:    QRS Duration: 76 QT Interval:  275 QTC Calculation: 430 R Axis:   80 Text Interpretation:  Sinus tachycardia Nonspecific repol abnormality, diffuse leads Confirmed by Blinda LeatherwoodPOLLINA  MD, Fares Ramthun [phone]) on 03/15/2016 5:22:24 AM       Radiology Ct Soft Tissue Neck W Contrast  Result Date: 03/15/2016 CLINICAL DATA:  21 year old female status post wisdom tooth extraction 4 days ago with left face and neck swelling. Initial encounter. EXAM: CT NECK WITH CONTRAST TECHNIQUE: Multidetector CT imaging of the neck was performed using the standard protocol following the bolus administration of intravenous contrast. CONTRAST:  75mL ISOVUE-300 IOPAMIDOL (ISOVUE-300) INJECTION 61% COMPARISON:  Neck CT 07/10/2005. FINDINGS: Pharynx and larynx: Negative larynx except for left supraglottic laryngeal wall edema mildly affecting the left aryepiglottic fold. There is also mild epiglottic thickening. Symmetric appearing adenoid hypertrophy. Palatine tonsillar enlargement, greater on the left. No tonsillar abscess. Lingual tonsillar hypertrophy. Generalized left parapharyngeal space inflammation with edema tracking along the left lateral wall of the pharynx. No retropharyngeal effusion or edema. Negative right parapharyngeal space. Salivary glands: Generalized inflammation in the left submandibular space, although both submandibular glands are diminutive or absent. Soft tissue stranding and edema mildly involves the posterolateral aspect of the left sublingual space. The central sublingual space remains normal. The inflammatory changes cross midline in the submental region and there is left greater than right  thickening of the platysma and perimandibular inflammatory stranding. Reactive lymphadenopathy is described below. The left masticator space also appears inflamed. There is no organized or drainable fluid collection. No sialolithiasis identified. Both parotid glands appear relatively spared. Thyroid: Negative. Lymph nodes: Reactive appearing left greater than right level 1 and level 2 lymphadenopathy. Mild asymmetric left lymph node hyper enhancement. No cystic or necrotic nodes. Vascular: Major vascular structures in the neck and at the skullbase are patent including the left internal jugular vein. Limited intracranial: Negative. Visualized orbits: Negative. Mastoids and visualized paranasal sinuses: Trace bilateral maxillary sinus mucosal thickening. Other visible paranasal sinuses, tympanic cavities, and mastoids are clear. Skeleton: Gas and fluid within the left mandible wisdom tooth extraction site (sagittal image 68). No associated mandible fracture. No mandible bone erosion to suggest osteomyelitis. The remaining wisdom teeth remain in place. No other acute dental finding. Other osseous structures are normal. Upper chest: Negative lung apices. Negative visualized superior mediastinum. No axillary lymphadenopathy. IMPRESSION: 1. Acute trans spatial inflammation with epicenter at the left submandibular and masticator spaces near the left mandible wisdom tooth extraction site which contains a small volume of fluid and gas. 2. Trans-spatial edema  and inflammation in the deep neck, especially the left parapharyngeal space and left lateral walls of the pharynx and larynx. Mild involvement also of the left sublingual space. 3. However, there is no abscess or drainable fluid collection. 4. Left greater than right reactive level 1 and 2 lymphadenopathy with no suppurative nodes. 5. Both submandibular glands appear chronically diminutive or absent. Electronically Signed   By: Odessa Fleming M.D.   On: 03/15/2016 07:26     Procedures Procedures (including critical care time)  Medications Ordered in ED Medications  fentaNYL (SUBLIMAZE) injection 50 mcg (not administered)  sodium chloride 0.9 % bolus 1,000 mL (0 mLs Intravenous Stopped 03/15/16 0713)  clindamycin (CLEOCIN) IVPB 900 mg (0 mg Intravenous Stopped 03/15/16 0645)  morphine 4 MG/ML injection 2 mg (2 mg Intravenous Given 03/15/16 0645)  ondansetron (ZOFRAN) injection 4 mg (4 mg Intravenous Given 03/15/16 0645)  iopamidol (ISOVUE-300) 61 % injection (75 mLs Intravenous Contrast Given 03/15/16 0624)  acetaminophen (TYLENOL) tablet 1,000 mg (1,000 mg Oral Given 03/15/16 0713)     Initial Impression / Assessment and Plan / ED Course  I have reviewed the triage vital signs and the nursing notes.  Pertinent labs & imaging results that were available during my care of the patient were reviewed by me and considered in my medical decision making (see chart for details).  Clinical Course     Patient presents to the [facility] with fever, swelling and pain of the left side of her face after wisdom tooth extraction. No external sign of cellulitis. Patient tachycardic, likely secondary to fever, pain and anxiety. She does not appear to be septic. There is no significant leukocytosis. Lactate is normal.  CT scan performed. There are diffuse inflammatory changes about the surgical site without abscess formation. This is consistent with soft tissue infection. Patient administered IV clindamycin at arrival. Will require continued IV antibiotics.  Final Clinical Impressions(s) / ED Diagnoses   Final diagnoses:  Odontogenic infection of jaw    New Prescriptions New Prescriptions   No medications on file     Gilda Crease, MD 03/15/16 [phone]

## 2016-03-15 NOTE — Progress Notes (Signed)
Pharmacy Antibiotic Note  Brenda Hale is a 21 y.o. female admitted on 03/15/2016 with dental abscess.  Plan: unasyn 3 g q8h     Temp (24hrs), Avg:101.8 F (38.8 C), Min:99.5 F (37.5 C), Max:102.9 F (39.4 C)   Recent Labs Lab 03/15/16 0518 03/15/16 0528  WBC 10.0  --   CREATININE 0.82  --   LATICACIDVEN  --  1.12    CrCl cannot be calculated (Unknown ideal weight.).    No Known Allergies  Brenda BlissMichael Brenda Hale, PharmD, BCPS, BCCCP Clinical Pharmacist Clinical phone for 03/15/2016 from 7a-3:30p: [phone] If after 3:30p, please call main pharmacy at: [phone] 03/15/2016 9:52 AM

## 2016-03-16 DIAGNOSIS — Y838 Other surgical procedures as the cause of abnormal reaction of the patient, or of later complication, without mention of misadventure at the time of the procedure: Secondary | ICD-10-CM | POA: Diagnosis present

## 2016-03-16 DIAGNOSIS — F17210 Nicotine dependence, cigarettes, uncomplicated: Secondary | ICD-10-CM | POA: Diagnosis present

## 2016-03-16 DIAGNOSIS — T814XXA Infection following a procedure, initial encounter: Secondary | ICD-10-CM | POA: Diagnosis not present

## 2016-03-16 DIAGNOSIS — M272 Inflammatory conditions of jaws: Secondary | ICD-10-CM | POA: Diagnosis not present

## 2016-03-16 DIAGNOSIS — F1010 Alcohol abuse, uncomplicated: Secondary | ICD-10-CM | POA: Diagnosis present

## 2016-03-16 DIAGNOSIS — R1310 Dysphagia, unspecified: Secondary | ICD-10-CM | POA: Diagnosis present

## 2016-03-16 NOTE — Care Management Note (Signed)
Case Management Note  Patient Details  Name: Brenda Hale MRN: [id number] Date of Birth: Nov 13, 1995  Subjective/Objective:                 Patient from home with abscess to mouth, recent wisdom teeth removal.  PCP: [facility]  [address] [address] [place], [place] [id number]  Contact: [facility]  Telephone: [phone]  Telephone: [phone]  Contact: [facility]  Work Phone: [phone]      Action/Plan:  DC to home self care when medically stable   Expected Discharge Date:                  Expected Discharge Plan:  Home/Self Care  In-House Referral:     Discharge planning Services  CM Consult  Post Acute Care Choice:    Choice offered to:     DME Arranged:    DME Agency:     HH Arranged:    HH Agency:     Status of Service:  In process, will continue to follow  If discussed at Long Length of Stay Meetings, dates discussed:    Additional Comments:  Lawerance SabalDebbie Clever Geraldo, RN 03/16/2016, 10:06 AM

## 2016-03-16 NOTE — Progress Notes (Signed)
Subjective: She is profoundly better. She is swallowing and swelling decreased  Objective: Vital signs in last 24 hours: Temp:  [97.8 F (36.6 C)-99.1 F (37.3 C)] 98.2 F (36.8 C) (01/18 0450) Pulse Rate:  [70-117] 87 (01/18 0450) Resp:  [14-20] 16 (01/18 0450) BP: (96-111)/(47-62) 96/47 (01/18 0450) SpO2:  [98 %-100 %] 100 % (01/18 0450) Last BM Date: 03/15/16  Intake/Output from previous day: 01/17 0701 - 01/18 0700 In: 1773.3 [I.V.:673.3; IV Piggyback:1100] Out: -  Intake/Output this shift: No intake/output data recorded.  she is awake and smiling. she has almost no trismus and the floor of mouth edema has resolved. The left neck swelling is less firm and decreased. voice is normal  Lab Results:   Recent Labs  03/15/16 0518  WBC 10.0  HGB 11.8*  HCT 36.0  PLT 208   BMET  Recent Labs  03/15/16 0518  NA 138  K 3.3*  CL 106  CO2 21*  GLUCOSE 112*  BUN 6  CREATININE 0.82  CALCIUM 9.1   PT/INR No results for input(s): LABPROT, INR in the last 72 hours. ABG No results for input(s): PHART, HCO3 in the last 72 hours.  Invalid input(s): PCO2, PO2  Studies/Results: Ct Soft Tissue Neck W Contrast  Result Date: 03/15/2016 CLINICAL DATA:  21 year old female status post wisdom tooth extraction 4 days ago with left face and neck swelling. Initial encounter. EXAM: CT NECK WITH CONTRAST TECHNIQUE: Multidetector CT imaging of the neck was performed using the standard protocol following the bolus administration of intravenous contrast. CONTRAST:  75mL ISOVUE-300 IOPAMIDOL (ISOVUE-300) INJECTION 61% COMPARISON:  Neck CT 07/10/2005. FINDINGS: Pharynx and larynx: Negative larynx except for left supraglottic laryngeal wall edema mildly affecting the left aryepiglottic fold. There is also mild epiglottic thickening. Symmetric appearing adenoid hypertrophy. Palatine tonsillar enlargement, greater on the left. No tonsillar abscess. Lingual tonsillar hypertrophy. Generalized  left parapharyngeal space inflammation with edema tracking along the left lateral wall of the pharynx. No retropharyngeal effusion or edema. Negative right parapharyngeal space. Salivary glands: Generalized inflammation in the left submandibular space, although both submandibular glands are diminutive or absent. Soft tissue stranding and edema mildly involves the posterolateral aspect of the left sublingual space. The central sublingual space remains normal. The inflammatory changes cross midline in the submental region and there is left greater than right thickening of the platysma and perimandibular inflammatory stranding. Reactive lymphadenopathy is described below. The left masticator space also appears inflamed. There is no organized or drainable fluid collection. No sialolithiasis identified. Both parotid glands appear relatively spared. Thyroid: Negative. Lymph nodes: Reactive appearing left greater than right level 1 and level 2 lymphadenopathy. Mild asymmetric left lymph node hyper enhancement. No cystic or necrotic nodes. Vascular: Major vascular structures in the neck and at the skullbase are patent including the left internal jugular vein. Limited intracranial: Negative. Visualized orbits: Negative. Mastoids and visualized paranasal sinuses: Trace bilateral maxillary sinus mucosal thickening. Other visible paranasal sinuses, tympanic cavities, and mastoids are clear. Skeleton: Gas and fluid within the left mandible wisdom tooth extraction site (sagittal image 68). No associated mandible fracture. No mandible bone erosion to suggest osteomyelitis. The remaining wisdom teeth remain in place. No other acute dental finding. Other osseous structures are normal. Upper chest: Negative lung apices. Negative visualized superior mediastinum. No axillary lymphadenopathy. IMPRESSION: 1. Acute trans spatial inflammation with epicenter at the left submandibular and masticator spaces near the left mandible wisdom tooth  extraction site which contains a small volume of fluid and gas. 2.  Trans-spatial edema and inflammation in the deep neck, especially the left parapharyngeal space and left lateral walls of the pharynx and larynx. Mild involvement also of the left sublingual space. 3. However, there is no abscess or drainable fluid collection. 4. Left greater than right reactive level 1 and 2 lymphadenopathy with no suppurative nodes. 5. Both submandibular glands appear chronically diminutive or absent. Electronically Signed   By: Odessa FlemingH  Hall M.D.   On: 03/15/2016 07:26    Anti-infectives: Anti-infectives    Start     Dose/Rate Route Frequency Ordered Stop   03/15/16 1430  clindamycin (CLEOCIN) IVPB 900 mg     900 mg 100 mL/hr over 30 Minutes Intravenous Every 8 hours 03/15/16 1424     03/15/16 1000  Ampicillin-Sulbactam (UNASYN) 3 g in sodium chloride 0.9 % 100 mL IVPB  Status:  Discontinued     3 g 200 mL/hr over 30 Minutes Intravenous Every 8 hours 03/15/16 0952 03/15/16 1424   03/15/16 0530  clindamycin (CLEOCIN) IVPB 900 mg     900 mg 100 mL/hr over 30 Minutes Intravenous  Once 03/15/16 0521 03/15/16 0645      Assessment/Plan: s/p * No surgery found * she is better and the floor of mouth swelling gone so risk of ludwigs is about zero. The neck swelling also has dramatically decreased. She is smiling and feeling well. She is swallowing now and she should be able to folow up with a oral surgeon for the care of her teeth. No further need for my services.   LOS: 0 days    Suzanna ObeyBYERS, Kischa Altice 03/16/2016

## 2016-03-16 NOTE — Progress Notes (Addendum)
   Subjective: Ms. Amada JupiterKirkpatrick was seen and evaluated today at bedside. Reports she is feeling significantly better since admission. Still having some discomfort with swallowing however much improved. Feels like she could tolerate a PO diet. Denies fevers or chills overnight.   Objective:  Vital signs in last 24 hours: Vitals:   03/15/16 1629 03/15/16 1950 03/15/16 2100 03/16/16 0450  BP: (!) 106/54  111/62 (!) 96/47  Pulse: 98 70 77 87  Resp: 20  16 16   Temp: 98.5 F (36.9 C)  97.8 F (36.6 C) 98.2 F (36.8 C)  TempSrc:   Oral Oral  SpO2: 100%  100% 100%   General: Young caucasian female resting comfortably in bed watchign tv. In no acute distress. Pleasant. Looks non-toxic.  HENT: EOMI. No conjunctival injection, icterus or ptosis. Neck with improved submandibular swelling/fullness and tenderness. Trismus improved however still present.  Cardiovascular: Regular rate and rhythm. No murmur or rub appreciated. Pulmonary: CTA BL, no wheezing, crackles or rhonchi appreciated. Unlabored breathing.  Abdomen: Soft, non-tender and non-distended. No guarding or rigidity. +bowel sounds.  Skin: Warm, dry. No cyanosis.  Psych: Mood normal and affect was mood congruent. Responds to questions appropriately.   Assessment/Plan:  Principal Problem:   Odontogenic infection of jaw Active Problems:   Tobacco abuse   Alcohol abuse  Odontogenic Infection of Jaw Swelling visibly improved today and patients trismus is improving as well. Feels like she is tolerating her diet well and denied any dyspnea or dysphagia. Currently on IV Clinda + steroids. -IV Clinda -Steroids -Liquid diet -Will try advancing diet tomorrow +/- PO meds -ENT following along, appreciate recs  Dispo: Anticipated discharge in approximately 1-2 day(s).   Dax Murguia, DO 03/16/2016, 12:28 PM Pager: [phone]

## 2016-03-17 DIAGNOSIS — M272 Inflammatory conditions of jaws: Secondary | ICD-10-CM

## 2016-03-17 LAB — HIV ANTIBODY (ROUTINE TESTING W REFLEX): HIV SCREEN 4TH GENERATION: NONREACTIVE

## 2016-03-17 MED ORDER — CLINDAMYCIN HCL 300 MG PO CAPS: 900.0000 mg | ORAL_CAPSULE | Freq: Three times a day (TID) | ORAL | 0 refills | Status: DC

## 2016-03-17 MED ORDER — CLINDAMYCIN HCL 300 MG PO CAPS
900.0000 mg | ORAL_CAPSULE | Freq: Three times a day (TID) | ORAL | Status: DC
  Administered 2016-03-17: 900 mg via ORAL
  Filled 2016-03-17: qty 3

## 2016-03-17 NOTE — Progress Notes (Signed)
   Subjective: Ms. Brenda Hale was seen and evaluated today at bedside. Reports continuing improvement of her symptoms. Denied any difficulty swallowing or breathing. Family brought in rice last night and was able to eat without any difficulty. In addition, denies any fevers or chills. Desires for discharge today.   Objective:  Vital signs in last 24 hours: Vitals:   03/16/16 0450 03/16/16 1632 03/17/16 0046 03/17/16 0527  BP: (!) 96/47 97/72 (!) 99/44 (!) 103/50  Pulse: 87 92 (!) 56 (!) 58  Resp: 16 17 20 16   Temp: 98.2 F (36.8 C) 98 F (36.7 C) 99 F (37.2 C) 98.3 F (36.8 C)  TempSrc: Oral Oral  Oral  SpO2: 100% 99% 100% 99%   General: Young non-toxic appearing caucasian female resting comfortably in bed. Talking on the phone. In no acute distress.  HENT: EOMI. No conjunctival injection, icterus or ptosis. No oropharynx edema. Neck with continued improvement of submandibular swelling and fullness. Trismus improved. Facial muscles symmetric.   Cardiovascular: Regular rate and rhythm. No murmur or rub appreciated. Pulmonary: CTA BL, no wheezing, crackles or rhonchi appreciated. Unlabored breathing.  Abdomen: Soft, non-tender and non-distended. No guarding or rigidity. +bowel sounds.  Extremities: No peripheral edema noted BL. Intact distal pulses. No gross deformities. Skin: Warm, dry. No cyanosis Psych: Mood normal and affect was mood congruent. Responds to questions appropriately.   Assessment/Plan:  Principal Problem:   Odontogenic infection of jaw Active Problems:   Tobacco abuse   Alcohol abuse  Odontogenic infection of Jaw Continues to have significant improvement. Family brought in rice dish and was able to tolerate without issue. Denied any dysphagia or dyspnea. ENT has signed off stating with her improvement and lack of swelling in the floor of her mouth, the risk of Ludwigs angina is near zero. With incredibly low risk of Ludwigs angina, I tend to want to discharge  patient home with course of PO Clindamycin. Case was discussed with ENT who agree with above course and prompt follow-up with oral surgeon. -Advance diet -Anticipate discharge today pending tolerating PO abx + diet -Rx to complete a 14 day course -Appointment scheduled with oral surgeon Monday Jan 22nd  Disp: Anticipated discharge today.  Shaguana Love, DO 03/17/2016, 7:35 AM Pager: [phone]

## 2016-03-17 NOTE — Progress Notes (Signed)
Pharmacy Students rounding with Internal Medicine Teaching Service. Asked to counsel on new oral antibiotic.  Counseled patient on new Clindamycin start regarding proper administration, indication, and potential side effects.  Presley RaddleHaley Johnette Teigen and John GiovanniMeredith McSwain, PharmD Candidates

## 2016-03-17 NOTE — Discharge Instructions (Signed)
We have prescribed you a 2 week course of antibiotics. Please take Clindamycin 3 tablets 3 times daily for 11 days. You will receive a total of 99 tablets to take over the next 11 days. Please do not miss a dose. I would recommend taking a Probiotic supplement to help replenish your normal bacteria. I have scheduled you an appointment with Dr. Hyacinth MeekerMiller, an Oral Surgeon. The appointment is scheduled for Monday Jan 22nd at 2:30 pm. Please call their office (phone number including in paper work) to reschedule if needed. Also, please bring your discharge papers with you to your appointment.

## 2016-03-19 NOTE — Discharge Summary (Signed)
Name: Brenda Hale MRN: [id number] DOB: Dec 02, 1995 20 y.o. PCP: Pcp Not In System  Date of Admission: 03/15/2016  5:00 AM Date of Discharge: 03/17/2016 Attending Physician: Debe Coder, MD  Discharge Diagnosis: 1. Odontogenic infection of jaw 2. Tobacco and alcohol abuse  Principal Problem:   Odontogenic infection of jaw Active Problems:   Tobacco abuse   Alcohol abuse   Discharge Medications: Allergies as of 03/17/2016   No Known Allergies     Medication List    TAKE these medications   clindamycin 300 MG capsule Commonly known as:  CLEOCIN Take 3 capsules (900 mg total) by mouth every 8 (eight) hours.   HYDROcodone-acetaminophen 5-325 MG tablet Commonly known as:  NORCO/VICODIN Take 1 tablet by mouth every 6 (six) hours as needed for moderate pain.   ibuprofen 800 MG tablet Commonly known as:  ADVIL,MOTRIN Take 1 tablet (800 mg total) by mouth 3 (three) times daily.       Disposition and follow-up:   Ms.Brenda Hale was discharged from [facility] in Stable condition.  At the hospital follow up visit please address:  1.  Odontogenic infection: This patient taking her prescribed clindamycin as directed? Is she having any difficulty swallowing, speaking or breathing Tobacco abuse: Encouraged cessation  2.  Labs / imaging needed at time of follow-up: None  3.  Pending labs/ test needing follow-up: None  Follow-up Appointments: Follow-up Information    MILLER,JOSEPH L, DDS. Go on 03/20/2016.   Specialty:  Oral Surgery Why:  I have scheduled you an appointment with Dr. Hyacinth Meeker 03/20/16 at 2:30 pm. Please call the office and reschedule if you are unable to make it.  Contact information: [address] [place] [address] [phone]           Hospital Course by problem list: Principal Problem:   Odontogenic infection of jaw Active Problems:   Tobacco abuse   Alcohol abuse   1. Odontogenic infection of  submandibular soft tissue [name] is a 21 year old female without medical history who presented for evaluation of throat swelling, difficulty swallowing, poor by mouth intake, difficulty opening mouth and shortness of breath for 4 days since having wisdom tooth extraction. She was not started on postprocedure antibiotics. CT scan of neck showed inflammatory changes around the site of the tooth extraction and soft tissue edema of the submandibular, masticator, left sublingual space and deep neck spaces. Notably no abscess or drainable fluid collection was observed. ENT was consulted who felt that this could have been early Ludwig angina and she was subsequently started on IV steroids and IV clindamycin. By the following hospital day her swelling and discomfort had improved significantly and patient was no longer having difficulty breathing or swallowing. There was resolution of the sublingual edema. She was transitioned to oral antibiotics on hospital day 2 after being able to tolerate a solid meal and remaining afebrile. She was subsequently discharged home with a course of oral Keflex and an appointment with an oral surgeon was made for the patient for the following Monday. Strict return precautions were given.  Discharge Vitals:   BP (!) 104/49   Pulse 93   Temp 99.3 F (37.4 C) (Oral)   Resp 18   SpO2 97%   Pertinent Labs, Studies, and Procedures:  CT soft tissue neck: Inflammation of the left submandibular, masticator and left parapharyngeal space and left lateral walls of the pharynx and larynx. No abscess or drainable fluid collection. Reactive lymphadenopathy CBC: Without  leukocytosis HIV nonreactive  Discharge Instructions:  Please complete her course of oral clindamycin and take as directed. Do not stop taking this medication even if he started to feel better. He has an appointment scheduled for you with an oral surgeon on Monday, please call and cancel if you cannot make this  appointment and make 1 soon after. It is important that you return to the emergency department if her symptoms should return and is also important that he follow up with an oral surgeon shortly after discharge.  SignedNoemi Hale: Brenda Allie, DO 03/19/2016, 2:18 PM   Pager: [phone]

## 2016-03-20 LAB — CULTURE, BLOOD (ROUTINE X 2)
CULTURE: NO GROWTH
Culture: NO GROWTH

## 2016-04-27 ENCOUNTER — Encounter (HOSPITAL_COMMUNITY): Payer: Self-pay

## 2016-04-27 ENCOUNTER — Emergency Department (HOSPITAL_COMMUNITY)
Admission: EM | Admit: 2016-04-27 | Discharge: 2016-04-28 | Disposition: A | Discharge status: Home / Self Care | Payer: Medicaid Other | Attending: Emergency Medicine | Admitting: Emergency Medicine

## 2016-04-27 DIAGNOSIS — F17210 Nicotine dependence, cigarettes, uncomplicated: Secondary | ICD-10-CM | POA: Insufficient documentation

## 2016-04-27 DIAGNOSIS — J039 Acute tonsillitis, unspecified: Secondary | ICD-10-CM | POA: Insufficient documentation

## 2016-04-27 DIAGNOSIS — J029 Acute pharyngitis, unspecified: Secondary | ICD-10-CM | POA: Diagnosis present

## 2016-04-27 HISTORY — DX: Acute tonsillitis, unspecified: J03.90

## 2016-04-27 LAB — CBC WITH DIFFERENTIAL/PLATELET
BASOS ABS: 0 10*3/uL (ref 0.0–0.1)
Basophils Relative: 0 %
EOS ABS: 0.1 10*3/uL (ref 0.0–0.7)
Eosinophils Relative: 0 %
HCT: 39.5 % (ref 36.0–46.0)
HEMOGLOBIN: 12.9 g/dL (ref 12.0–15.0)
LYMPHS ABS: 2.3 10*3/uL (ref 0.7–4.0)
LYMPHS PCT: 14 %
MCH: 28 pg (ref 26.0–34.0)
MCHC: 32.7 g/dL (ref 30.0–36.0)
MCV: 85.7 fL (ref 78.0–100.0)
Monocytes Absolute: 1.1 10*3/uL — ABNORMAL HIGH (ref 0.1–1.0)
Monocytes Relative: 7 %
NEUTROS PCT: 79 %
Neutro Abs: 13.2 10*3/uL — ABNORMAL HIGH (ref 1.7–7.7)
Platelets: 268 10*3/uL (ref 150–400)
RBC: 4.61 MIL/uL (ref 3.87–5.11)
RDW: 13 % (ref 11.5–15.5)
WBC: 16.7 10*3/uL — AB (ref 4.0–10.5)

## 2016-04-27 LAB — COMPREHENSIVE METABOLIC PANEL
ALT: 18 U/L (ref 14–54)
AST: 23 U/L (ref 15–41)
Albumin: 4.2 g/dL (ref 3.5–5.0)
Alkaline Phosphatase: 54 U/L (ref 38–126)
Anion gap: 7 (ref 5–15)
BUN: 11 mg/dL (ref 6–20)
CHLORIDE: 105 mmol/L (ref 101–111)
CO2: 25 mmol/L (ref 22–32)
Calcium: 10.1 mg/dL (ref 8.9–10.3)
Creatinine, Ser: 0.86 mg/dL (ref 0.44–1.00)
GFR calc non Af Amer: >60 mL/min (ref >60)
Glucose, Bld: 109 mg/dL — ABNORMAL HIGH (ref 65–99)
POTASSIUM: 3.6 mmol/L (ref 3.5–5.1)
Sodium: 137 mmol/L (ref 135–145)
Total Bilirubin: 0.8 mg/dL (ref 0.3–1.2)
Total Protein: 7.5 g/dL (ref 6.5–8.1)

## 2016-04-27 LAB — RAPID STREP SCREEN (MED CTR MEBANE ONLY): STREPTOCOCCUS, GROUP A SCREEN (DIRECT): NEGATIVE

## 2016-04-27 LAB — I-STAT CG4 LACTIC ACID, ED: LACTIC ACID, VENOUS: 1.16 mmol/L (ref 0.5–1.9)

## 2016-04-27 LAB — I-STAT BETA HCG BLOOD, ED (MC, WL, AP ONLY): I-stat hCG, quantitative: <5 m[IU]/mL (ref <5)

## 2016-04-27 MED ORDER — IBUPROFEN 200 MG PO TABS
ORAL_TABLET | ORAL | Status: AC
  Filled 2016-04-27: qty 3

## 2016-04-27 MED ORDER — IBUPROFEN 400 MG PO TABS
600.0000 mg | ORAL_TABLET | Freq: Once | ORAL | Status: AC
  Administered 2016-04-27: 21:00:00 600 mg via ORAL

## 2016-04-27 NOTE — ED Triage Notes (Signed)
Pt endorses sore throat and has had tonsillitis "14 times" Pt febrile in triage 101.5 and HR 160. Pt last took tylenol 1000mg  at 1600.

## 2016-04-28 MED ORDER — AMOXICILLIN 500 MG PO CAPS: 1000.0000 mg | ORAL_CAPSULE | Freq: Two times a day (BID) | ORAL | 0 refills | Status: DC

## 2016-04-28 MED ORDER — AMOXICILLIN 500 MG PO CAPS
1000.0000 mg | ORAL_CAPSULE | Freq: Once | ORAL | Status: AC
  Administered 2016-04-28: 1000 mg via ORAL
  Filled 2016-04-28: qty 2

## 2016-04-28 NOTE — ED Notes (Signed)
Pt tender to middle L shoulder blade.

## 2016-04-28 NOTE — ED Provider Notes (Signed)
[facility]-EMERGENCY DEPT Provider Note   CSN: [id number] Arrival date & time: 04/27/16  2021     History   Chief Complaint Chief Complaint  Patient presents with  . Sore Throat     HPI  Blood pressure 108/55, pulse 99, temperature 99.7 F (37.6 C), temperature source Oral, resp. rate 18, height 4\' 10"  (1.473 m), weight 70.8 kg, last menstrual period 04/27/2016, SpO2 98 %.  Brenda Hale is a 21 y.o. female complaining of sore throat onset 2 days ago with associated fever. Patient denies difficulty swallowing, cough, shortness of breath. She states is consistent with prior tonsillitis exacerbations. She states this happens frequently.  Past Medical History:  Diagnosis Date  . Chronic headaches   . Tonsillitis     Patient Active Problem List   Diagnosis Date Noted  . 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 Tobacco abuse 03/15/2016  . Alcohol abuse 03/15/2016    Past Surgical History:  Procedure Laterality Date  . WISDOM TOOTH EXTRACTION      OB History    No data available       Home Medications    Prior to Admission medications   Medication Sig Start Date End Date Taking? Authorizing Provider  amoxicillin (AMOXIL) 500 MG capsule Take 2 capsules (1,000 mg total) by mouth 2 (two) times daily. 04/28/16   Adeliz Tonkinson, PA-C  clindamycin (CLEOCIN) 300 MG capsule Take 3 capsules (900 mg total) by mouth every 8 (eight) hours. 03/17/16   Bethany Molt, DO  HYDROcodone-acetaminophen (NORCO/VICODIN) 5-325 MG tablet Take 1 tablet by mouth every 6 (six) hours as needed for moderate pain.    Historical Provider, MD  ibuprofen (ADVIL,MOTRIN) 800 MG tablet Take 1 tablet (800 mg total) by mouth 3 (three) times daily. 11/13/15   Dahlia ClientHannah Muthersbaugh, PA-C    Family History History reviewed. No pertinent family history.  Social History Social History  Substance Use Topics  . Smoking status: Current Every Day Smoker    Packs/day: 0.50    Types: Cigarettes  . Smokeless  tobacco: Never Used  . Alcohol use Yes     Comment: occ     Allergies   Grapeseed extract [nutritional supplements]   Review of Systems Review of Systems   10 systems reviewed and found to be negative, except as noted in the HPI.   Physical Exam Updated Vital Signs BP 108/55 (BP Location: Right Arm)   Pulse 99   Temp 99.7 F (37.6 C) (Oral)   Resp 18   Ht 4\' 10"  (1.473 m)   Wt 70.8 kg   LMP 04/27/2016 (Exact Date)   SpO2 98%   BMI 32.60 kg/m   Physical Exam  Constitutional: She appears well-developed and well-nourished.  HENT:  Head: Normocephalic.  Right Ear: External ear normal.  Left Ear: External ear normal.  Mouth/Throat: Oropharynx is clear and moist. No oropharyngeal exudate.  No drooling or stridor. 3+ tonsillar hypertrophy with exudate, uvula is midline, soft palate rises symmetrically.  No TTP or induration under tongue.   No tenderness to palpation of frontal or bilateral maxillary sinuses.  Mild mucosal edema in the nares with scant rhinorrhea.  Bilateral tympanic membranes with normal architecture and good light reflex.    Eyes: Conjunctivae and EOM are normal. Pupils are equal, round, and reactive to light.  Neck: Normal range of motion. Neck supple.  Cardiovascular: Normal rate and regular rhythm.   Pulmonary/Chest: Effort normal and breath sounds normal. No stridor. No respiratory distress. She has no wheezes.  She has no rales. She exhibits no tenderness.  Abdominal: Soft. There is no tenderness. There is no rebound and no guarding.  Nursing note and vitals reviewed.    ED Treatments / Results  Labs (all labs ordered are listed, but only abnormal results are displayed) Labs Reviewed  COMPREHENSIVE METABOLIC PANEL - Abnormal; Notable for the following:       Result Value   Glucose, Bld 109 (*)    All other components within normal limits  CBC WITH DIFFERENTIAL/PLATELET - Abnormal; Notable for the following:    WBC 16.7 (*)    Neutro Abs  13.2 (*)    Monocytes Absolute 1.1 (*)    All other components within normal limits  RAPID STREP SCREEN (NOT AT [facility])  CULTURE, GROUP A STREP ([facility])  I-STAT CG4 LACTIC ACID, ED  I-STAT BETA HCG BLOOD, ED ([facility], [facility], [facility] ONLY)    EKG  EKG Interpretation  Date/Time:  Friday April 28 2016 00:01:59 EST Ventricular Rate:  101 PR Interval:    QRS Duration: 87 QT Interval:  333 QTC Calculation: 432 R Axis:   62 Text Interpretation:  Sinus tachycardia Otherwise within normal limits When compared with ECG of 03/15/2016, HEART RATE has decreased Nonspecific ST and T wave abnormality is no longer present Confirmed by [facility]  MD, DAVID ([id number]) on 04/28/2016 12:14:22 AM       Radiology No results found.  Procedures Procedures (including critical care time)  Medications Ordered in ED Medications  ibuprofen (ADVIL,MOTRIN) 200 MG tablet (not administered)  ibuprofen (ADVIL,MOTRIN) tablet 600 mg (600 mg Oral Given 04/27/16 2036)  amoxicillin (AMOXIL) capsule 1,000 mg (1,000 mg Oral Given 04/28/16 0036)     Initial Impression / Assessment and Plan / ED Course  I have reviewed the triage vital signs and the nursing notes.  Pertinent labs & imaging results that were available during my care of the patient were reviewed by me and considered in my medical decision making (see chart for details).     Vitals:   04/27/16 2213 04/28/16 0000 04/28/16 0002 04/28/16 0019  BP:  108/55 108/55   Pulse:  101 99   Resp:   18   Temp: 99.7 F (37.6 C)     TempSrc: Oral     SpO2:  99% 98% 98%  Weight:      Height:        Medications  ibuprofen (ADVIL,MOTRIN) 200 MG tablet (not administered)  ibuprofen (ADVIL,MOTRIN) tablet 600 mg (600 mg Oral Given 04/27/16 2036)  amoxicillin (AMOXIL) capsule 1,000 mg (1,000 mg Oral Given 04/28/16 0036)    Brenda Hale is 21 y.o. female presenting with Sore throat, physical exam consistent with a tonsillitis, rapid strep is negative however will treat based on  symptoms. Patient will be given amoxicillin, no signs of PTA, RPA. Patient was initially quite tachycardic, commensurate with her level of fever. This has resolved after antipyretics were administered. Triage initiated EKG was sinus tach.  Evaluation does not show pathology that would require ongoing emergent intervention or inpatient treatment. Pt is hemodynamically stable and mentating appropriately. Discussed findings and plan with patient/guardian, who agrees with care plan. All questions answered. Return precautions discussed and outpatient follow up given.      Final Clinical Impressions(s) / ED Diagnoses   Final diagnoses:  Tonsillitis    New Prescriptions New Prescriptions   AMOXICILLIN (AMOXIL) 500 MG CAPSULE    Take 2 capsules (1,000 mg total) by mouth 2 (two) times daily.  Wynetta Emery, PA-C 04/28/16 0050    Dione Booze, MD 04/28/16 [phone]

## 2016-04-28 NOTE — Discharge Instructions (Signed)
Return to the emergency room for any worsening or concerning symptoms including fast breathing, heart racing, confusion, vomiting. ° °Rest, cover your mouth when you cough and wash your hands frequently.  ° °Push fluids: water or Gatorade, do not drink any soda, juice or caffeinated beverages. ° °For fever and pain control you can take Motrin (ibuprofen) as follows: 400 mg (this is normally 2 over the counter pills) °Three hours after you have had the Motrin take Tylenol (acetaminophen) as follows: You can take 650 mg (this is normally 2 over-the-counter pills) °Repeat the series by taking Motrin 3 hours later, continue to do this while you are awake. ° °Check to see that any other over-the-counter medications don't contain acetaminophen: you shouldn't have more than 3000 mg a day. ° °Do not return to work until 48 hours after your fever breaks.  ° °

## 2016-05-01 LAB — CULTURE, GROUP A STREP (THRC)

## 2016-07-18 ENCOUNTER — Ambulatory Visit (INDEPENDENT_AMBULATORY_CARE_PROVIDER_SITE_OTHER): Payer: Medicaid Other | Admitting: Obstetrics & Gynecology

## 2016-07-18 ENCOUNTER — Other Ambulatory Visit (HOSPITAL_COMMUNITY)
Admission: RE | Admit: 2016-07-18 | Discharge: 2016-07-18 | Disposition: A | Discharge status: Home / Self Care | Payer: Medicaid Other | Source: Ambulatory Visit | Attending: Obstetrics & Gynecology | Admitting: Obstetrics & Gynecology

## 2016-07-18 ENCOUNTER — Encounter: Payer: Self-pay | Admitting: Obstetrics & Gynecology

## 2016-07-18 DIAGNOSIS — N920 Excessive and frequent menstruation with regular cycle: Secondary | ICD-10-CM | POA: Diagnosis not present

## 2016-07-18 DIAGNOSIS — R102 Pelvic and perineal pain: Secondary | ICD-10-CM | POA: Diagnosis present

## 2016-07-18 DIAGNOSIS — B9689 Other specified bacterial agents as the cause of diseases classified elsewhere: Secondary | ICD-10-CM | POA: Diagnosis not present

## 2016-07-18 DIAGNOSIS — N760 Acute vaginitis: Secondary | ICD-10-CM | POA: Insufficient documentation

## 2016-07-18 DIAGNOSIS — A549 Gonococcal infection, unspecified: Secondary | ICD-10-CM | POA: Diagnosis not present

## 2016-07-18 DIAGNOSIS — A749 Chlamydial infection, unspecified: Secondary | ICD-10-CM | POA: Insufficient documentation

## 2016-07-18 MED ORDER — IBUPROFEN 600 MG PO TABS: 600.0000 mg | ORAL_TABLET | Freq: Three times a day (TID) | ORAL | 1 refills | Status: DC

## 2016-07-18 NOTE — Progress Notes (Signed)
Patient think she has a cyst. She has very heavy periods, cramping and severe pain -10.

## 2016-07-18 NOTE — Progress Notes (Signed)
Patient ID: Brenda Hale, female   DOB: [birth year], 21 y.o.   MRN: [id number]  Chief Complaint  Patient presents with  . Gynecologic Exam    NEW PT ANNUAL  heavy periods and pelvic pain  HPI Brenda BundeCheyenne Lafoy is a 21 y.o. female.  [id number] Patient's last menstrual period was 06/27/2016 (exact date). Menses are regular but flow is heavy and Midol does not help her cramps. Uses no BCM and declines any prescription for this.  HPI  Past Medical History:  Diagnosis Date  . Chronic headaches   . Tonsillitis     Past Surgical History:  Procedure Laterality Date  . WISDOM TOOTH EXTRACTION      Family History  Problem Relation Age of Onset  . Cancer Mother   . Depression Mother   . Drug abuse Mother   . Miscarriages / [place] Mother   . Early death Father   . Heart disease Maternal Grandmother   . Hypertension Maternal Grandmother   . Miscarriages / Stillbirths Maternal Grandmother     Social History Social History  Substance Use Topics  . Smoking status: Current Every Day Smoker    Packs/day: 0.50    Types: Cigarettes  . Smokeless tobacco: Never Used  . Alcohol use Yes     Comment: occ    Allergies  Allergen Reactions  . Grapeseed Extract [Nutritional Supplements] Nausea And Vomiting    Current Outpatient Prescriptions  Medication Sig Dispense Refill  . amoxicillin (AMOXIL) 500 MG capsule Take 2 capsules (1,000 mg total) by mouth 2 (two) times daily. (Patient not taking: Reported on 07/18/2016) 40 capsule 0  . clindamycin (CLEOCIN) 300 MG capsule Take 3 capsules (900 mg total) by mouth every 8 (eight) hours. (Patient not taking: Reported on 07/18/2016) 99 capsule 0  . HYDROcodone-acetaminophen (NORCO/VICODIN) 5-325 MG tablet Take 1 tablet by mouth every 6 (six) hours as needed for moderate pain.    Marland Kitchen. ibuprofen (ADVIL,MOTRIN) 800 MG tablet Take 1 tablet (800 mg total) by mouth 3 (three) times daily. (Patient not taking: Reported on 07/18/2016) 21 tablet 0    No current facility-administered medications for this visit.     Review of Systems Review of Systems  Constitutional: Negative.   Respiratory: Negative.   Genitourinary: Positive for menstrual problem and pelvic pain (cramps with and without menses). Negative for vaginal bleeding.    Blood pressure 110/73, pulse 72, temperature 97.1 F (36.2 C), height 4\' 10"  (1.473 m), weight 160 lb 11.2 oz (72.9 kg), last menstrual period 06/27/2016.  Physical Exam Physical Exam  Constitutional: She appears well-developed. No distress.  Pulmonary/Chest: Effort normal.  Abdominal: Soft. She exhibits no distension. There is no tenderness.  Genitourinary: Vagina normal and uterus normal. No vaginal discharge found.  Skin: Skin is warm and dry.  Psychiatric: She has a normal mood and affect. Her behavior is normal.    Data Reviewed   Assessment    Patient Active Problem List   Diagnosis Date Noted  . Pelvic pain in female 07/18/2016  . Menorrhagia 07/18/2016  . 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 Tobacco abuse 03/15/2016  . Alcohol abuse 03/15/2016  FH of ovarian cancer     Plan    Pelvic US Ibuprofen during menses       Scheryl DarterJames Yobany Vroom 07/18/2016, 1:44 PM

## 2016-07-18 NOTE — Patient Instructions (Signed)

## 2016-07-19 LAB — CERVICOVAGINAL ANCILLARY ONLY
Bacterial vaginitis: POSITIVE — AB
CHLAMYDIA, DNA PROBE: POSITIVE — AB
Candida vaginitis: NEGATIVE
Neisseria Gonorrhea: POSITIVE — AB
Trichomonas: NEGATIVE

## 2016-07-21 ENCOUNTER — Other Ambulatory Visit: Payer: Self-pay | Admitting: *Deleted

## 2016-07-21 DIAGNOSIS — N760 Acute vaginitis: Secondary | ICD-10-CM

## 2016-07-21 DIAGNOSIS — A749 Chlamydial infection, unspecified: Secondary | ICD-10-CM

## 2016-07-21 DIAGNOSIS — B9689 Other specified bacterial agents as the cause of diseases classified elsewhere: Secondary | ICD-10-CM

## 2016-07-21 MED ORDER — METRONIDAZOLE 500 MG PO TABS: 500.0000 mg | ORAL_TABLET | Freq: Two times a day (BID) | ORAL | 0 refills | Status: DC

## 2016-07-21 MED ORDER — AZITHROMYCIN 250 MG PO TABS: 1000.0000 mg | ORAL_TABLET | Freq: Once | ORAL | 0 refills | Status: AC

## 2016-07-21 NOTE — Progress Notes (Unsigned)
See lab

## 2016-07-26 ENCOUNTER — Ambulatory Visit: Payer: Medicaid Other

## 2016-07-28 ENCOUNTER — Ambulatory Visit (HOSPITAL_COMMUNITY): Payer: Medicaid Other | Attending: Obstetrics & Gynecology

## 2016-07-31 ENCOUNTER — Telehealth: Payer: Self-pay | Admitting: *Deleted

## 2016-07-31 NOTE — Telephone Encounter (Signed)
Attempt to contact pt regarding missed appt for Rocephin injection. Both numbers in chart are "not taking call at this time". Will make health dept aware.

## 2017-05-06 ENCOUNTER — Encounter (HOSPITAL_COMMUNITY): Payer: Self-pay | Admitting: Emergency Medicine

## 2017-05-06 ENCOUNTER — Emergency Department (HOSPITAL_COMMUNITY): Payer: Self-pay

## 2017-05-06 ENCOUNTER — Emergency Department (HOSPITAL_COMMUNITY)
Admission: EM | Admit: 2017-05-06 | Discharge: 2017-05-07 | Disposition: A | Discharge status: Home / Self Care | Payer: Self-pay | Attending: Emergency Medicine | Admitting: Emergency Medicine

## 2017-05-06 DIAGNOSIS — R10811 Right upper quadrant abdominal tenderness: Secondary | ICD-10-CM

## 2017-05-06 DIAGNOSIS — F17210 Nicotine dependence, cigarettes, uncomplicated: Secondary | ICD-10-CM | POA: Insufficient documentation

## 2017-05-06 DIAGNOSIS — K2900 Acute gastritis without bleeding: Secondary | ICD-10-CM | POA: Insufficient documentation

## 2017-05-06 DIAGNOSIS — R1013 Epigastric pain: Secondary | ICD-10-CM | POA: Insufficient documentation

## 2017-05-06 DIAGNOSIS — R079 Chest pain, unspecified: Secondary | ICD-10-CM | POA: Insufficient documentation

## 2017-05-06 DIAGNOSIS — R1011 Right upper quadrant pain: Secondary | ICD-10-CM | POA: Insufficient documentation

## 2017-05-06 LAB — HEPATIC FUNCTION PANEL
ALBUMIN: 3.7 g/dL (ref 3.5–5.0)
ALT: 11 U/L — ABNORMAL LOW (ref 14–54)
AST: 17 U/L (ref 15–41)
Alkaline Phosphatase: 48 U/L (ref 38–126)
Bilirubin, Direct: <0.1 mg/dL — ABNORMAL LOW (ref 0.1–0.5)
TOTAL PROTEIN: 6.2 g/dL — AB (ref 6.5–8.1)
Total Bilirubin: 0.1 mg/dL — ABNORMAL LOW (ref 0.3–1.2)

## 2017-05-06 LAB — BASIC METABOLIC PANEL
ANION GAP: 7 (ref 5–15)
BUN: 6 mg/dL (ref 6–20)
CHLORIDE: 109 mmol/L (ref 101–111)
CO2: 24 mmol/L (ref 22–32)
Calcium: 9 mg/dL (ref 8.9–10.3)
Creatinine, Ser: 0.73 mg/dL (ref 0.44–1.00)
GFR calc Af Amer: >60 mL/min (ref >60)
Glucose, Bld: 86 mg/dL (ref 65–99)
POTASSIUM: 3.6 mmol/L (ref 3.5–5.1)
Sodium: 140 mmol/L (ref 135–145)

## 2017-05-06 LAB — CBC
HEMATOCRIT: 39.3 % (ref 36.0–46.0)
HEMOGLOBIN: 13 g/dL (ref 12.0–15.0)
MCH: 29.7 pg (ref 26.0–34.0)
MCHC: 33.1 g/dL (ref 30.0–36.0)
MCV: 89.9 fL (ref 78.0–100.0)
Platelets: 266 10*3/uL (ref 150–400)
RBC: 4.37 MIL/uL (ref 3.87–5.11)
RDW: 12.8 % (ref 11.5–15.5)
WBC: 7.8 10*3/uL (ref 4.0–10.5)

## 2017-05-06 LAB — I-STAT TROPONIN, ED: Troponin i, poc: 0 ng/mL (ref 0.00–0.08)

## 2017-05-06 LAB — D-DIMER, QUANTITATIVE (NOT AT ARMC)

## 2017-05-06 LAB — LIPASE, BLOOD: LIPASE: 28 U/L (ref 11–51)

## 2017-05-06 LAB — I-STAT BETA HCG BLOOD, ED (MC, WL, AP ONLY)

## 2017-05-06 MED ORDER — SODIUM CHLORIDE 0.9 % IV BOLUS (SEPSIS)
1000.0000 mL | Freq: Once | INTRAVENOUS | Status: AC
  Administered 2017-05-06: 1000 mL via INTRAVENOUS

## 2017-05-06 NOTE — ED Provider Notes (Cosign Needed)
[facility] EMERGENCY DEPARTMENT Provider Note   CSN: [id number] Arrival date & time: 05/06/17  1926     History   Chief Complaint Chief Complaint  Patient presents with  . Chest Pain    HPI Brenda Hale is a 22 y.o. female.  HPI   Brenda Hale is a 23 y.o. female, with a history of alcohol abuse, presenting to the ED with epigastric pain.  States she was having intermittent epigastric pain for the past month, however, pain is been constant for the last 3-4 days and worsened today.  Pain is sharp/stabbing, currently 6/10, nonradiating.  Pain was improved with marijuana use.  Patient states for the last 2 months she has been a daily drinker, consuming at least two 40oz beers daily.  The pain will sometimes worsen with alcohol use, but will also worsen with eating.  Accompanied by nausea. Patient also endorses a feeling of chest tightness centered around the lower chest and shortness of breath.  Declined pain management at initial interview. Denies fever/chills, vomiting, diarrhea, hematochezia/melena, cough, or any other complaints.    Past Medical History:  Diagnosis Date  . Chronic headaches   . Tonsillitis     Patient Active Problem List   Diagnosis Date Noted  . Pelvic pain in female 07/18/2016  . Menorrhagia 07/18/2016  . 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 Tobacco abuse 03/15/2016  . Alcohol abuse 03/15/2016    Past Surgical History:  Procedure Laterality Date  . WISDOM TOOTH EXTRACTION      OB History    Gravida Para Term Preterm AB Living   1       1 0   SAB TAB Ectopic Multiple Live Births                   Home Medications    Prior to Admission medications   Medication Sig Start Date End Date Taking? Authorizing Provider  HYDROcodone-acetaminophen (NORCO/VICODIN) 5-325 MG tablet Take 1 tablet by mouth every 6 (six) hours as needed for moderate pain.    [provider]  ibuprofen (ADVIL,MOTRIN) 600  MG tablet Take 1 tablet (600 mg total) by mouth 3 (three) times daily. During menstrual period 07/18/16   Adam Phenix, MD    Family History Family History  Problem Relation Age of Onset  . Cancer Mother   . Depression Mother   . Drug abuse Mother   . Miscarriages / [place] Mother   . Early death Father   . Heart disease Maternal Grandmother   . Hypertension Maternal Grandmother   . Miscarriages / Stillbirths Maternal Grandmother     Social History Social History   Tobacco Use  . Smoking status: Current Every Day Smoker    Packs/day: 0.50    Types: Cigarettes  . Smokeless tobacco: Never Used  Substance Use Topics  . Alcohol use: Yes    Comment: occ  . Drug use: Yes    Types: Marijuana    Comment: occ     Allergies   Grapeseed extract [nutritional supplements]   Review of Systems Review of Systems  Constitutional: Negative for chills, diaphoresis and fever.  Respiratory: Positive for chest tightness and shortness of breath.   Cardiovascular: Negative for leg swelling.  Gastrointestinal: Positive for abdominal pain and nausea. Negative for blood in stool, constipation, diarrhea and vomiting.  Genitourinary: Negative for flank pain.  Musculoskeletal: Negative for back pain.  All other systems reviewed and are negative.    Physical Exam  Updated Vital Signs BP 115/85 (BP Location: Right Arm)   Pulse 73   Temp 98.5 F (36.9 C) (Oral)   Resp 18   Ht 4\' 10"  (1.473 m)   Wt 59.9 kg (132 lb)   SpO2 100%   BMI 27.59 kg/m   Physical Exam  Constitutional: She appears well-developed and well-nourished. No distress.  HENT:  Head: Normocephalic and atraumatic.  Eyes: Conjunctivae are normal.  Neck: Neck supple.  Cardiovascular: Normal rate, regular rhythm, normal heart sounds and intact distal pulses.  Pulmonary/Chest: Effort normal and breath sounds normal. No respiratory distress.  Patient shows no increased work of breathing.  Speaks in full sentences  without noted difficulty.  No noted hesitation with deep breathing.  Abdominal: Soft. There is tenderness in the epigastric area. There is no guarding.    Musculoskeletal: She exhibits no edema.  Lymphadenopathy:    She has no cervical adenopathy.  Neurological: She is alert.  Skin: Skin is warm and dry. She is not diaphoretic.  Psychiatric: She has a normal mood and affect. Her behavior is normal.  Nursing note and vitals reviewed.    ED Treatments / Results  Labs (all labs ordered are listed, but only abnormal results are displayed) Labs Reviewed  BASIC METABOLIC PANEL  CBC  HEPATIC FUNCTION PANEL  LIPASE, BLOOD  D-DIMER, QUANTITATIVE (NOT AT [facility])  I-STAT TROPONIN, ED  I-STAT BETA HCG BLOOD, ED ([facility], [facility], [facility] ONLY)    EKG  EKG Interpretation None       Radiology Dg Chest 2 View  Result Date: 05/06/2017 CLINICAL DATA:  22 year old female with chest pain. EXAM: CHEST - 2 VIEW COMPARISON:  None. FINDINGS: The heart size and mediastinal contours are within normal limits. Both lungs are clear. The visualized skeletal structures are unremarkable. IMPRESSION: No active cardiopulmonary disease. Electronically Signed   By: Elgie Collard M.D.   On: 05/06/2017 20:01    Procedures Procedures (including critical care time)  Medications Ordered in ED Medications  sodium chloride 0.9 % bolus 1,000 mL (0 mLs Intravenous Stopped 05/06/17 2212)     Initial Impression / Assessment and Plan / ED Course  I have reviewed the triage vital signs and the nursing notes.  Pertinent labs & imaging results that were available during my care of the patient were reviewed by me and considered in my medical decision making (see chart for details).      Patient presents with epigastric pain.  Patient has history elements and physical exam gives suspicion for possible pancreatitis.  Patient is nontoxic appearing, afebrile, not tachypneic, not hypotensive, SPO2 of 100% on room air, and is in  no apparent distress.  Patient also has complaint of chest tightness and shortness of breath.  Initially PERC negative, however, patient then became tachycardic and therefore a d-dimer was added.  End of shift patient care handoff report given to Graciella Freer, PA-C.  Plan: Lipase, hepatic function panel, EKG read, and d-dimer pending. RUQ [place] also pending. If lipase level is consistent with pancreatitis, I think patient is appropriate for oral fluid challenge, instructions for liquid diet, Zofran, pain management, and GI referral. If no further information is gained with lab results, CT of the abdomen may be considered.    Vitals:   05/06/17 1934 05/06/17 1939 05/06/17 2132  BP: 115/85  (!) 105/57  Pulse: 73  (!) 102  Resp: 18  18  Temp: 98.5 F (36.9 C)    TempSrc: Oral    SpO2: 100%  100%  Weight: 83.9 kg (185 lb) 59.9 kg (132 lb)   Height: 4\' 10"  (1.473 m) 4\' 10"  (1.473 m)       Final Clinical Impressions(s) / ED Diagnoses   Final diagnoses:  Epigastric pain  RUQ abdominal tenderness    ED Discharge Orders    None       Anselm PancoastJoy, Naphtali Riede C, PA-C 05/06/17 2221

## 2017-05-06 NOTE — ED Provider Notes (Signed)
Care assumed from [place], [place] at shift change with labs and U/S pending.   In brief, this patient is a 22 y.o. female who presents for evaluation of epigastric pain.  Reports that she has been having epigastric pain for over a month but reports the last few days, has become more frequent, severe.  Patient does report a history of daily alcohol.  Also comes in for chest tightness and shortness of breath.  Shortness of breath is worse with pain and on exertion.  She states she has noticed when she is walking, she gets more out of breath.   PLAN: Patient is pending labs and ultrasound.  Concern for pancreatitis given patient's history and physical exam.  His lab work is unremarkable and patient is still having pain, consider CT abdomen pelvis.  Also consider CTA of chest if patient is still having chest tightness and shortness of breath.  MDM:  Lipase unremarkable.  D-dimer is negative.  Troponin is negative.  Liver functions show normal AST, ALT and alk phos. Discussed results with patient.  Patient reports that she still having pain in the epigastric region.  I discussed at length regarding patient's symptoms.  Patient reports that today she came because she was worried about the persistent pain and the fact that she was becoming more short of breath.  She reports that short of shortness of breath is worse when her pain becomes worse.  Additionally, patient reports that shortness of breath is worse when she walks.  Patient had initially declined any analgesics.  She is still declining analgesics at this time.  I discussed at length regarding further workup.  Given that she still having tenderness, will plan for CT abdomen pelvis.  I discussed at length regarding CTA of chest.  Patient reports she still feels like she is having shortness of breath.  Vital signs are stable.  Oxygen has remained 100% on room air.  I discussed with previous provider regarding patient's shortness of breath symptoms with a  negative d-dimer.  There is concerned that even with a negative d-dimer and patient was still symptomatic, patient might need a CTA of the chest.  Plan for CT abdomen pelvis and CTA chest.  Patient signed out to [facility], PA-C with CT abd/pelvis and CTA chest pending. Please see her note for further ED course. Anticipate discharge of patient.     1. Acute gastritis without hemorrhage, unspecified gastritis type   2. Epigastric pain   3. RUQ abdominal tenderness          Desma Mcgregor 05/07/17 0102    Davonna Belling, MD 05/09/17 [phone]

## 2017-05-06 NOTE — ED Triage Notes (Signed)
Reports epigastric pain for the last 3-4 days that radiates across the chest.  Reports pain got worse today.  Diagnosed with anxiety in December.

## 2017-05-07 ENCOUNTER — Emergency Department (HOSPITAL_COMMUNITY): Payer: Self-pay

## 2017-05-07 MED ORDER — IOPAMIDOL (ISOVUE-370) INJECTION 76%
INTRAVENOUS | Status: AC
  Administered 2017-05-07: 100 mL
  Filled 2017-05-07: qty 100

## 2017-05-07 MED ORDER — SUCRALFATE 1 G PO TABS: 1.0000 g | ORAL_TABLET | Freq: Three times a day (TID) | ORAL | 0 refills | Status: DC

## 2017-05-07 NOTE — Discharge Instructions (Signed)
Your work up today was reassuring. As we discussed, you may be having some inflammation of your stomach from your alcohol use.  Take Carafate as directed.   [facility] in the next 24-48 hours for further evaluation. I have also provided you with a GI doctor you can follow-up with regarding your symptoms.   Return to the Emergency Department for fever, chest pain, difficulty breathing, abdominal pain, vomiting, or any other worsening or concerning symptoms.

## 2017-05-07 NOTE — ED Provider Notes (Signed)
Care assumed from Graciella Freer PA-C who took sign out from [place], [place].  Please see his full H&P.  In short,  Brenda Hale is a 22 y.o. female presents for evaluation of epigastric pain that has been present for over 1 month but worsened in the last few days.  She has a history of daily alcohol usage.  Patient also has associated chest tightness and shortness of breath only when the pain comes but reports that during this time the pain and shortness of breath worsened with exertion.  Physical Exam  BP (!) 105/57 (BP Location: Right Arm)   Pulse (!) 102   Temp 98.5 F (36.9 C) (Oral)   Resp 18   Ht 4\' 10"  (1.473 m)   Wt 59.9 kg (132 lb)   SpO2 100%   BMI 27.59 kg/m   Physical Exam  Constitutional: She appears well-developed and well-nourished. No distress.  HENT:  Head: Normocephalic.  Eyes: Conjunctivae are normal. No scleral icterus.  Neck: Normal range of motion.  Pulmonary/Chest: Effort normal.  Musculoskeletal: Normal range of motion.  Neurological: She is alert.  Nursing note and vitals reviewed.   ED Course/Procedures   Clinical Course as of May 07 201  Mon May 07, 2017  0130 Plan: Imaging pending.  If negative she may be discharged home.  [HM]    Clinical Course User Index [HM] Delmont Prosch, Boyd Kerbs    Results for orders placed or performed during the hospital encounter of 05/06/17  Basic metabolic panel  Result Value Ref Range   Sodium 140 135 - 145 mmol/L   Potassium 3.6 3.5 - 5.1 mmol/L   Chloride 109 101 - 111 mmol/L   CO2 24 22 - 32 mmol/L   Glucose, Bld 86 65 - 99 mg/dL   BUN 6 6 - 20 mg/dL   Creatinine, Ser 1.61 0.44 - 1.00 mg/dL   Calcium 9.0 8.9 - 09.6 mg/dL   GFR calc non Af Amer >60 >60 mL/min   GFR calc Af Amer >60 >60 mL/min   Anion gap 7 5 - 15  CBC  Result Value Ref Range   WBC 7.8 4.0 - 10.5 K/uL   RBC 4.37 3.87 - 5.11 MIL/uL   Hemoglobin 13.0 12.0 - 15.0 g/dL   HCT 04.5 40.9 - 81.1 %   MCV 89.9 78.0 - 100.0 fL    MCH 29.7 26.0 - 34.0 pg   MCHC 33.1 30.0 - 36.0 g/dL   RDW 91.4 78.2 - 95.6 %   Platelets 266 150 - 400 K/uL  Hepatic function panel  Result Value Ref Range   Total Protein 6.2 (L) 6.5 - 8.1 g/dL   Albumin 3.7 3.5 - 5.0 g/dL   AST 17 15 - 41 U/L   ALT 11 (L) 14 - 54 U/L   Alkaline Phosphatase 48 38 - 126 U/L   Total Bilirubin 0.1 (L) 0.3 - 1.2 mg/dL   Bilirubin, Direct <2.1 (L) 0.1 - 0.5 mg/dL   Indirect Bilirubin NOT CALCULATED 0.3 - 0.9 mg/dL  Lipase, blood  Result Value Ref Range   Lipase 28 11 - 51 U/L  D-dimer, quantitative  Result Value Ref Range   D-Dimer, Quant <0.27 0.00 - 0.50 ug/mL-FEU  I-stat troponin, ED  Result Value Ref Range   Troponin i, poc 0.00 0.00 - 0.08 ng/mL   Comment 3          I-Stat beta hCG blood, ED  Result Value Ref Range   I-stat hCG, quantitative <  5.0 <5 mIU/mL   Comment 3           Dg Chest 2 View  Result Date: 05/06/2017 CLINICAL DATA:  22 year old female with chest pain. EXAM: CHEST - 2 VIEW COMPARISON:  None. FINDINGS: The heart size and mediastinal contours are within normal limits. Both lungs are clear. The visualized skeletal structures are unremarkable. IMPRESSION: No active cardiopulmonary disease. Electronically Signed   By: Elgie Collard M.D.   On: 05/06/2017 20:01   Ct Angio Chest Pe W And/or Wo Contrast  Result Date: 05/07/2017 CLINICAL DATA:  22 y/o F; chest tightness, shortness of breath, and right upper quadrant/epigastric abdominal pain. EXAM: CT ANGIOGRAPHY CHEST CT ABDOMEN AND PELVIS WITH CONTRAST TECHNIQUE: Multidetector CT imaging of the chest was performed using the standard protocol during bolus administration of intravenous contrast. Multiplanar CT image reconstructions and MIPs were obtained to evaluate the vascular anatomy. Multidetector CT imaging of the abdomen and pelvis was performed using the standard protocol during bolus administration of intravenous contrast. CONTRAST:  ISOVUE-370 IOPAMIDOL (ISOVUE-370)  INJECTION 76% COMPARISON:  05/06/2017 right upper quadrant ultrasound. FINDINGS: CTA CHEST FINDINGS Cardiovascular: Satisfactory opacification of the pulmonary arteries to the segmental level. No evidence of pulmonary embolism. Normal heart size. No pericardial effusion. Mediastinum/Nodes: No enlarged mediastinal, hilar, or axillary lymph nodes. Thyroid gland, trachea, and esophagus demonstrate no significant findings. Lungs/Pleura: Lungs are clear. No pleural effusion or pneumothorax. Musculoskeletal: No chest wall abnormality. No acute or significant osseous findings. Review of the MIP images confirms the above findings. CT ABDOMEN and PELVIS FINDINGS Hepatobiliary: No focal liver abnormality is seen. No gallstones, gallbladder wall thickening, or biliary dilatation. Pancreas: Unremarkable. No pancreatic ductal dilatation or surrounding inflammatory changes. Spleen: Normal in size without focal abnormality. 15 mm splenule within the hilum. Adrenals/Urinary Tract: Adrenal glands are unremarkable. Kidneys are normal, without renal calculi, focal lesion, or hydronephrosis. Bladder is unremarkable. Stomach/Bowel: Stomach is within normal limits. Appendix appears normal. No evidence of bowel wall thickening, distention, or inflammatory changes. Vascular/Lymphatic: No significant vascular findings are present. No enlarged abdominal or pelvic lymph nodes. Reproductive: Simple appearing 2.3 cm left adnexal cyst. 1.8 cm cyst with thick rim of enhancement, likely corpus luteum in left adnexa. Small volume of low-attenuation fluid within pelvis and right lower quadrant. Other: No abdominal wall hernia or abnormality. No abdominopelvic ascites. Musculoskeletal: No acute or significant osseous findings. Review of the MIP images confirms the above findings. IMPRESSION: 1. Normal CT of the chest.  No pulmonary embolus identified. 2. Small volume of simple appearing fluid within the pelvis, more than expected for physiologic,  possibly adnexal cyst rupture. 3. Otherwise unremarkable CT of abdomen and pelvis. Electronically Signed   By: Mitzi Hansen M.D.   On: 05/07/2017 01:24   Ct Abdomen Pelvis W Contrast  Result Date: 05/07/2017 CLINICAL DATA:  22 y/o F; chest tightness, shortness of breath, and right upper quadrant/epigastric abdominal pain. EXAM: CT ANGIOGRAPHY CHEST CT ABDOMEN AND PELVIS WITH CONTRAST TECHNIQUE: Multidetector CT imaging of the chest was performed using the standard protocol during bolus administration of intravenous contrast. Multiplanar CT image reconstructions and MIPs were obtained to evaluate the vascular anatomy. Multidetector CT imaging of the abdomen and pelvis was performed using the standard protocol during bolus administration of intravenous contrast. CONTRAST:  ISOVUE-370 IOPAMIDOL (ISOVUE-370) INJECTION 76% COMPARISON:  05/06/2017 right upper quadrant ultrasound. FINDINGS: CTA CHEST FINDINGS Cardiovascular: Satisfactory opacification of the pulmonary arteries to the segmental level. No evidence of pulmonary embolism. Normal heart size. No  pericardial effusion. Mediastinum/Nodes: No enlarged mediastinal, hilar, or axillary lymph nodes. Thyroid gland, trachea, and esophagus demonstrate no significant findings. Lungs/Pleura: Lungs are clear. No pleural effusion or pneumothorax. Musculoskeletal: No chest wall abnormality. No acute or significant osseous findings. Review of the MIP images confirms the above findings. CT ABDOMEN and PELVIS FINDINGS Hepatobiliary: No focal liver abnormality is seen. No gallstones, gallbladder wall thickening, or biliary dilatation. Pancreas: Unremarkable. No pancreatic ductal dilatation or surrounding inflammatory changes. Spleen: Normal in size without focal abnormality. 15 mm splenule within the hilum. Adrenals/Urinary Tract: Adrenal glands are unremarkable. Kidneys are normal, without renal calculi, focal lesion, or hydronephrosis. Bladder is  unremarkable. Stomach/Bowel: Stomach is within normal limits. Appendix appears normal. No evidence of bowel wall thickening, distention, or inflammatory changes. Vascular/Lymphatic: No significant vascular findings are present. No enlarged abdominal or pelvic lymph nodes. Reproductive: Simple appearing 2.3 cm left adnexal cyst. 1.8 cm cyst with thick rim of enhancement, likely corpus luteum in left adnexa. Small volume of low-attenuation fluid within pelvis and right lower quadrant. Other: No abdominal wall hernia or abnormality. No abdominopelvic ascites. Musculoskeletal: No acute or significant osseous findings. Review of the MIP images confirms the above findings. IMPRESSION: 1. Normal CT of the chest.  No pulmonary embolus identified. 2. Small volume of simple appearing fluid within the pelvis, more than expected for physiologic, possibly adnexal cyst rupture. 3. Otherwise unremarkable CT of abdomen and pelvis. Electronically Signed   By: Mitzi HansenLance  Furusawa-Stratton M.D.   On: 05/07/2017 01:24   [place] Abdomen Limited Ruq  Result Date: 05/06/2017 CLINICAL DATA:  Right upper quadrant pain for several days EXAM: ULTRASOUND ABDOMEN LIMITED RIGHT UPPER QUADRANT COMPARISON:  None. FINDINGS: Gallbladder: Gallbladder is partially contracted and due to a postprandial state. Small gallstones are noted without complicating factors. Common bile duct: Diameter: 2 mm. Liver: No focal lesion identified. Within normal limits in parenchymal echogenicity. Portal vein is patent on color Doppler imaging with normal direction of blood flow towards the liver. IMPRESSION: Cholelithiasis without acute abnormality. Electronically Signed   By: Alcide CleverMark  Lukens M.D.   On: 05/06/2017 23:18     Procedures  MDM   Patient with epigastric abdominal pain, likely alcoholic gastritis.  She reported to initial provider shortness of breath on exertion and had one documented heart rate of 102.  He is well-appearing on my exam.  I have personally  reviewed the images from her CT scan chest and abdomen.  There is no evidence of pulmonary embolism or acute abdominal pathology.  Noted small amount of pelvic fluid however she has no lower abdominal pain.  Abdomen is soft and nontender in the lower regions.  Patient will be discharged home with close primary care follow-up.  Patient states understanding and is in agreement with this plan.  Acute gastritis without hemorrhage, unspecified gastritis type  Epigastric pain - Plan: US Abdomen Limited RUQ, US Abdomen Limited RUQ  RUQ abdominal tenderness - Plan: US Abdomen Limited RUQ, US Abdomen Limited RUQ       Conlan Miceli, Boyd KerbsHannah, PA-C 05/07/17 0203    Gilda CreasePollina, Christopher J, MD 05/07/17 [phone]

## 2017-06-14 ENCOUNTER — Encounter (HOSPITAL_COMMUNITY): Payer: Self-pay | Admitting: Emergency Medicine

## 2017-06-14 ENCOUNTER — Other Ambulatory Visit: Payer: Self-pay

## 2017-06-14 ENCOUNTER — Emergency Department (HOSPITAL_COMMUNITY)
Admission: EM | Admit: 2017-06-14 | Discharge: 2017-06-14 | Disposition: A | Discharge status: Home / Self Care | Payer: Medicaid Other | Attending: Emergency Medicine | Admitting: Emergency Medicine

## 2017-06-14 DIAGNOSIS — J020 Streptococcal pharyngitis: Secondary | ICD-10-CM

## 2017-06-14 DIAGNOSIS — N390 Urinary tract infection, site not specified: Secondary | ICD-10-CM

## 2017-06-14 DIAGNOSIS — F17210 Nicotine dependence, cigarettes, uncomplicated: Secondary | ICD-10-CM | POA: Insufficient documentation

## 2017-06-14 DIAGNOSIS — R319 Hematuria, unspecified: Secondary | ICD-10-CM | POA: Insufficient documentation

## 2017-06-14 DIAGNOSIS — Z79899 Other long term (current) drug therapy: Secondary | ICD-10-CM | POA: Insufficient documentation

## 2017-06-14 DIAGNOSIS — R05 Cough: Secondary | ICD-10-CM | POA: Insufficient documentation

## 2017-06-14 LAB — URINALYSIS, ROUTINE W REFLEX MICROSCOPIC
Bilirubin Urine: NEGATIVE
Glucose, UA: NEGATIVE mg/dL
Hgb urine dipstick: NEGATIVE
Ketones, ur: 20 mg/dL — AB
Nitrite: POSITIVE — AB
Protein, ur: NEGATIVE mg/dL
Specific Gravity, Urine: 1.008 (ref 1.005–1.030)
pH: 6 (ref 5.0–8.0)

## 2017-06-14 LAB — POC URINE PREG, ED: Preg Test, Ur: NEGATIVE

## 2017-06-14 LAB — GROUP A STREP BY PCR: Group A Strep by PCR: DETECTED — AB

## 2017-06-14 MED ORDER — PENICILLIN G BENZATHINE 1200000 UNIT/2ML IM SUSP
1.2000 10*6.[IU] | Freq: Once | INTRAMUSCULAR | Status: AC
  Administered 2017-06-14: 1.2 10*6.[IU] via INTRAMUSCULAR
  Filled 2017-06-14: qty 2

## 2017-06-14 MED ORDER — ONDANSETRON 4 MG PO TBDP
4.0000 mg | ORAL_TABLET | Freq: Once | ORAL | Status: AC
  Administered 2017-06-14: 4 mg via ORAL
  Filled 2017-06-14: qty 1

## 2017-06-14 MED ORDER — ACETAMINOPHEN 325 MG PO TABS
650.0000 mg | ORAL_TABLET | Freq: Once | ORAL | Status: AC
  Administered 2017-06-14: 650 mg via ORAL
  Filled 2017-06-14: qty 2

## 2017-06-14 MED ORDER — NITROFURANTOIN MONOHYD MACRO 100 MG PO CAPS: 100.0000 mg | ORAL_CAPSULE | Freq: Two times a day (BID) | ORAL | 0 refills | Status: DC

## 2017-06-14 NOTE — ED Triage Notes (Signed)
Pt with complaints of sore throat, swollen tonsils, N/V and unable to control bladder. Pt reports having tonsillitis at least 20 times within the last 3 years. Pt reports having a miscarriage in Feb this year and has not been able to control bladder since then.

## 2017-06-14 NOTE — ED Provider Notes (Signed)
[facility] EMERGENCY DEPARTMENT Provider Note   CSN: [id number] Arrival date & time: 06/14/17  1134     History   Chief Complaint Chief Complaint  Patient presents with  . Sore Throat    HPI Brenda Hale is a 22 y.o. female.  HPI   22 year old female presents today with complaints of sore throat.  Patient notes history of frequent episodes of tonsillitis.  She notes since 2015 she has had approximately 20 episodes.  Patient notes usually manage these at home and does not come into the emergency room.  Patient notes symptoms started yesterday with sore throat, fever, body aches.  Patient reports dry nonproductive cough, denies any chest pain shortness of breath, denies any abdominal pain.  Patient denies any close sick contacts, reports this feels similar to previous episodes.  She notes taking Tylenol last night, none today.  Patient also notes a miscarriage in February approximately 2 months ago that was very early on.  She notes since that time she has had difficulty holding her urine, she denies any dysuria associated with this, pelvic pain, vaginal discharge or bleeding  Patient.  She notes normal menstrual cycle last month, but has not had her menstrual cycle this month.  Past Medical History:  Diagnosis Date  . Chronic headaches   . Tonsillitis     Patient Active Problem List   Diagnosis Date Noted  . Pelvic pain in female 07/18/2016  . Menorrhagia 07/18/2016  . 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 Tobacco abuse 03/15/2016  . Alcohol abuse 03/15/2016    Past Surgical History:  Procedure Laterality Date  . WISDOM TOOTH EXTRACTION       OB History    Gravida  1   Para      Term      Preterm      AB  1   Living  0     SAB      TAB      Ectopic      Multiple      Live Births               Home Medications    Prior to Admission medications   Medication Sig Start Date End Date Taking? Authorizing Provider    nitrofurantoin, macrocrystal-monohydrate, (MACROBID) 100 MG capsule Take 1 capsule (100 mg total) by mouth 2 (two) times daily. 06/14/17   Rishawn Walck, Tinnie Gens, PA-C  sucralfate (CARAFATE) 1 g tablet Take 1 tablet (1 g total) by mouth 4 (four) times daily -  with meals and at bedtime. 05/07/17 06/06/17  Maxwell Caul, PA-C    Family History Family History  Problem Relation Age of Onset  . Cancer Mother   . Depression Mother   . Drug abuse Mother   . Miscarriages / [place] Mother   . Early death Father   . Heart disease Maternal Grandmother   . Hypertension Maternal Grandmother   . Miscarriages / Stillbirths Maternal Grandmother     Social History Social History   Tobacco Use  . Smoking status: Current Every Day Smoker    Packs/day: 0.50    Types: Cigarettes  . Smokeless tobacco: Never Used  Substance Use Topics  . Alcohol use: Yes    Comment: occ  . Drug use: Yes    Types: Marijuana    Comment: occ     Allergies   Grapeseed extract [nutritional supplements]   Review of Systems Review of Systems  All other systems reviewed and  are negative.   Physical Exam Updated Vital Signs BP 110/60   Pulse 96   Temp 98.6 F (37 C) (Oral)   Resp 18   Ht 4\' 10"  (1.473 m)   Wt 59.9 kg (132 lb)   SpO2 100%   BMI 27.59 kg/m   Physical Exam  Constitutional: She is oriented to person, place, and time. She appears well-developed and well-nourished.  HENT:  Head: Normocephalic and atraumatic.  Bilateral tonsillar swelling with friability, no discrete exudate, tonsils are symmetrical, uvula midline rises with phonation, no edema noted, no signs of RPA or PTA, voice normal, neck supple full active range of motion, no pooling of secretions  Eyes: Pupils are equal, round, and reactive to light. Conjunctivae are normal. Right eye exhibits no discharge. Left eye exhibits no discharge. No scleral icterus.  Neck: Normal range of motion. No JVD present. No tracheal deviation present.   Cardiovascular: Regular rhythm, normal heart sounds and intact distal pulses.  Pulmonary/Chest: Effort normal and breath sounds normal. No stridor. No respiratory distress. She has no wheezes. She has no rales. She exhibits no tenderness.  Abdominal: Soft. She exhibits no distension and no mass. There is no tenderness. There is no rebound and no guarding. No hernia.  Neurological: She is alert and oriented to person, place, and time. Coordination normal.  Psychiatric: She has a normal mood and affect. Her behavior is normal. Judgment and thought content normal.  Nursing note and vitals reviewed.  ED Treatments / Results  Labs (all labs ordered are listed, but only abnormal results are displayed) Labs Reviewed  GROUP A STREP BY PCR - Abnormal; Notable for the following components:      Result Value   Group A Strep by PCR DETECTED (*)    All other components within normal limits  URINALYSIS, ROUTINE W REFLEX MICROSCOPIC - Abnormal; Notable for the following components:   APPearance HAZY (*)    Ketones, ur 20 (*)    Nitrite POSITIVE (*)    Leukocytes, UA MODERATE (*)    Bacteria, UA MANY (*)    Squamous Epithelial / LPF 6-30 (*)    All other components within normal limits  URINE CULTURE  POC URINE PREG, ED    EKG None  Radiology No results found.  Procedures Procedures (including critical care time)  Medications Ordered in ED Medications  acetaminophen (TYLENOL) tablet 650 mg (650 mg Oral Given 06/14/17 1323)  ondansetron (ZOFRAN-ODT) disintegrating tablet 4 mg (4 mg Oral Given 06/14/17 1323)  penicillin g benzathine (BICILLIN LA) 1200000 UNIT/2ML injection 1.2 Million Units (1.2 Million Units Intramuscular Given 06/14/17 1635)     Initial Impression / Assessment and Plan / ED Course  I have reviewed the triage vital signs and the nursing notes.  Pertinent labs & imaging results that were available during my care of the patient were reviewed by me and considered in my  medical decision making (see chart for details).     Final Clinical Impressions(s) / ED Diagnoses   Final diagnoses:  Strep pharyngitis  Urinary tract infection with hematuria, site unspecified    Labs: Rapid strep, urinalysis, point-of-care urine pregnancy  Imaging:  Consults:  Therapeutics: Tylenol, Zofran, penicillin   Discharge Meds: Macrobid   Assessment/Plan:    22 year old female presents today with several complaints.  Patient with sore throat likely secondary to strep.  Patient has had frequent episodes of this.  She will be treated with IM penicillin here.  Patient will need outpatient follow-up with ENT  as she has recurring symptoms.  She has no signs of deep space infection, no comp gating features.  She was given Tylenol which resolved her fever and tachycardia she is tolerating p.o. in no acute distress.  Patient is also had several months of urinary symptoms and urinalysis consistent with urinary tract infection.  Patient will be placed on Macrobid.  I have very low suspicion that her urinary symptoms are causing her fever and this is more likely strep related.  She will return to the emergency room in 2 days if symptoms persist or sooner if they worsen.  Patient will follow-up as an outpatient return immediately with any new or worsening signs or symptoms.  Patient verbalized understanding and agreement to today's plan had no further questions or concerns    ED Discharge Orders        Ordered    nitrofurantoin, macrocrystal-monohydrate, (MACROBID) 100 MG capsule  2 times daily     06/14/17 1639       HedgesTinnie Gens, PA-C 06/14/17 1721    Rolan Bucco, MD 06/15/17 [phone]

## 2017-06-14 NOTE — Discharge Instructions (Signed)
Please read attached information. If you experience any new or worsening signs or symptoms please return to the emergency room for evaluation. Please follow-up with your primary care provider or specialist as discussed. Please use medication prescribed only as directed and discontinue taking if you have any concerning signs or symptoms.   °

## 2017-06-15 ENCOUNTER — Telehealth: Payer: Self-pay | Admitting: *Deleted

## 2017-06-15 NOTE — Telephone Encounter (Signed)
Post ED Visit - Positive Culture Follow-up  Culture report reviewed by antimicrobial stewardship pharmacist:  []  Enzo BiNathan Batchelder, Pharm.D. []  Celedonio MiyamotoJeremy Frens, [address]Ph[address] BCPS AQ-ID []  Garvin FilaMike Maccia, Pharm.D., BCPS []  Georgina PillionElizabeth Martin, Pharm.D., BCPS []  TutuillaMinh Pham, [address]Ph[address] BCPS, AAHIVP [x]  Estella HuskMichelle Turner, Pharm.D., BCPS, AAHIVP []  Lysle Pearlachel Rumbarger, PharmD, BCPS []  Blake DivineShannon Parkey, PharmD []  Pollyann SamplesAndy Johnston, PharmD, BCPS  Positive strep culture Treated with Penicillin q benzathine, organism sensitive to the same and no further patient follow-up is required at this time.  Virl AxeRobertson, Galen Russman [facility] 06/15/2017, 9:32 AM

## 2017-06-16 LAB — URINE CULTURE: Culture: >=100000 — AB

## 2019-01-16 IMAGING — US US ABDOMEN LIMITED
1 series · 14 of 25 positions shown · non-contrast
Comparison: None.

CLINICAL DATA: Right upper quadrant pain for several days

EXAM:
ULTRASOUND ABDOMEN LIMITED RIGHT UPPER QUADRANT

[Series 1: us abdomen limited · 0.20mm/px · 14 of 51 slices shown]
[im 1/51]
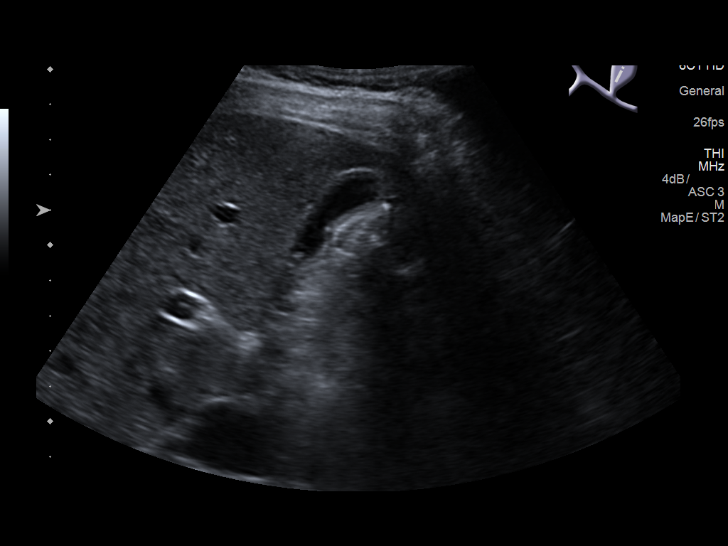
[im 5/51]
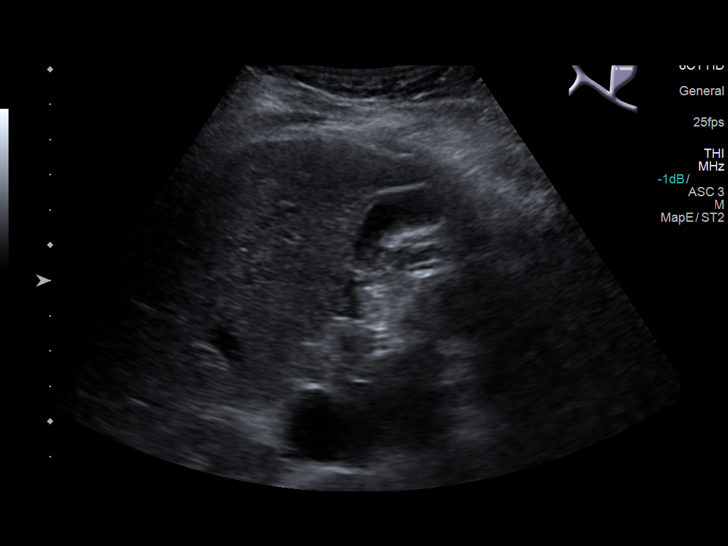
[im 9/51]
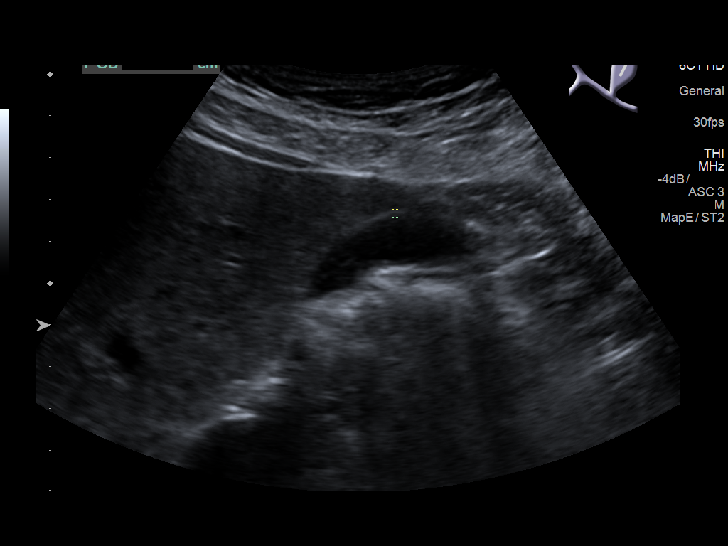
[im 13/51]
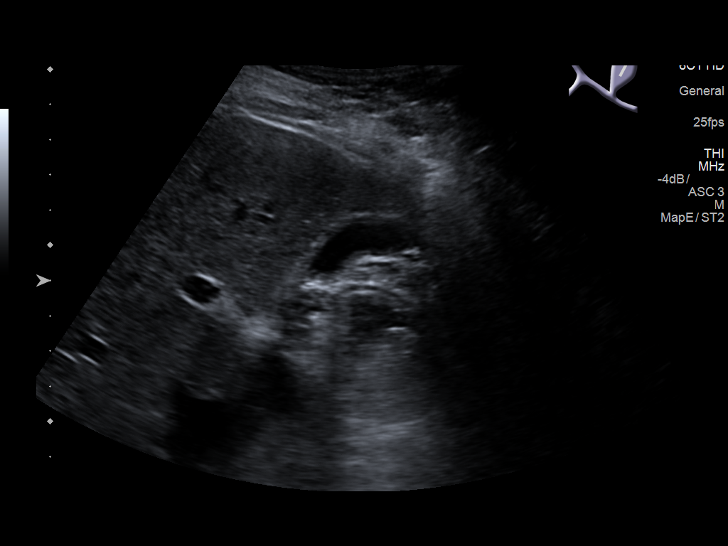
[im 17/51]
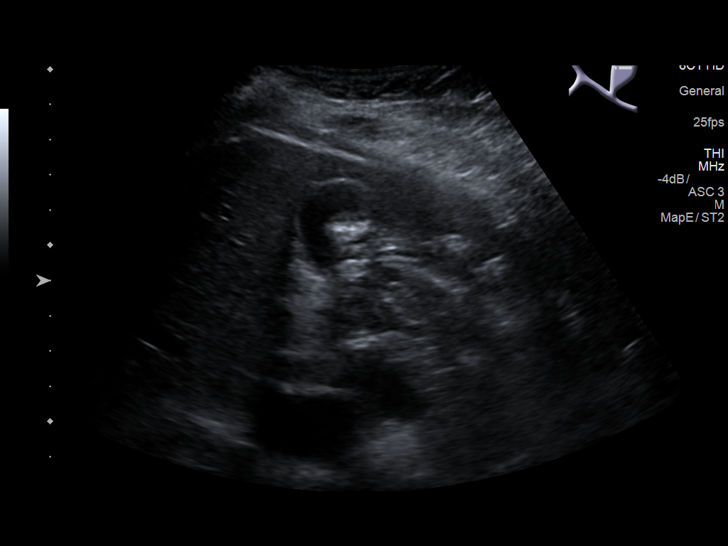
[im 19/51]
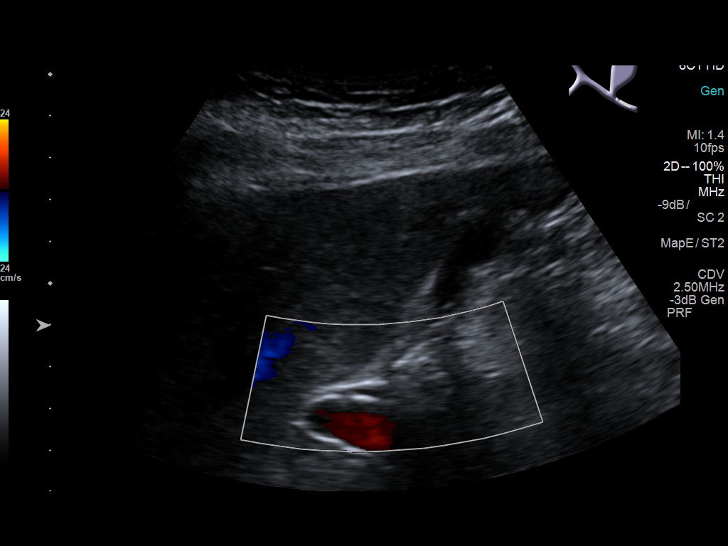
[im 23/51]
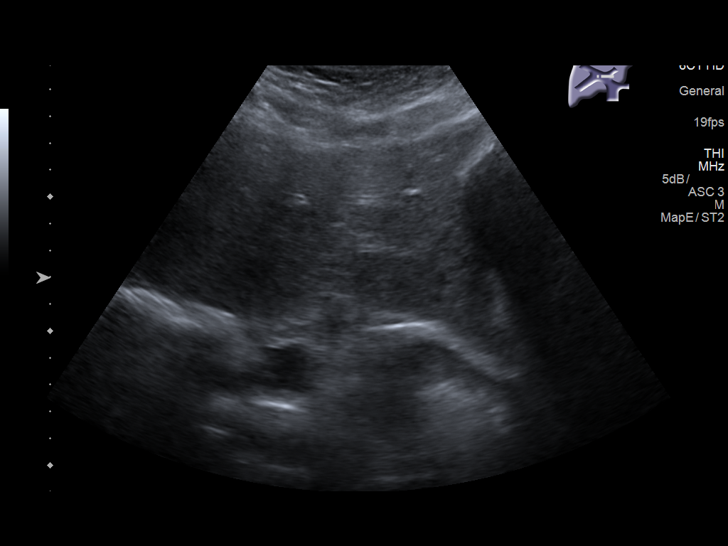
[im 28/51]
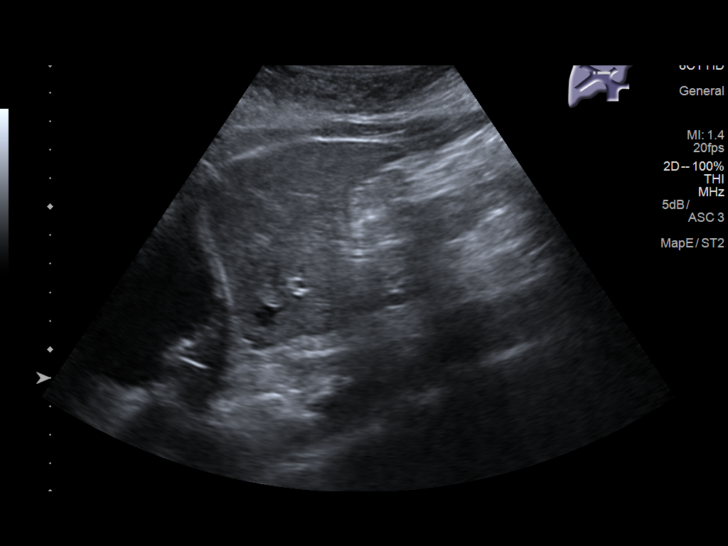
[im 32/51]
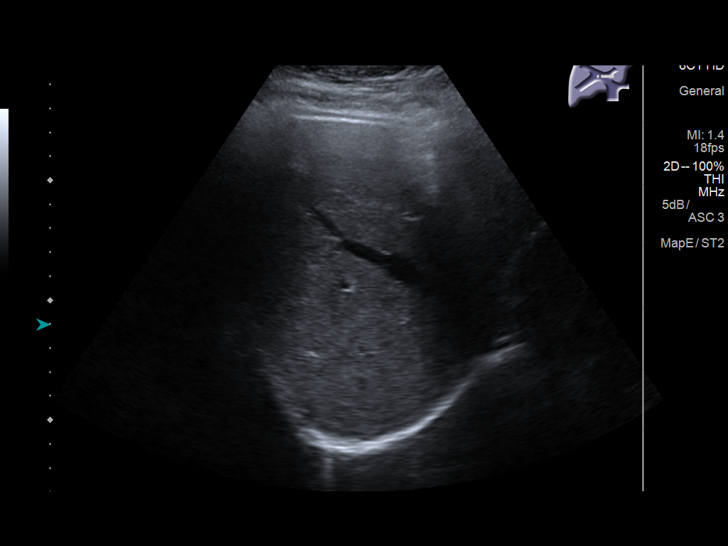
[im 34/51]
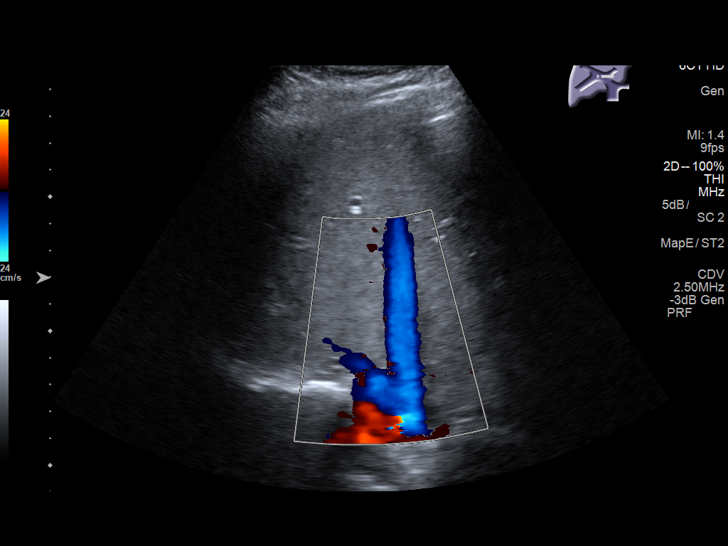
[im 38/51]
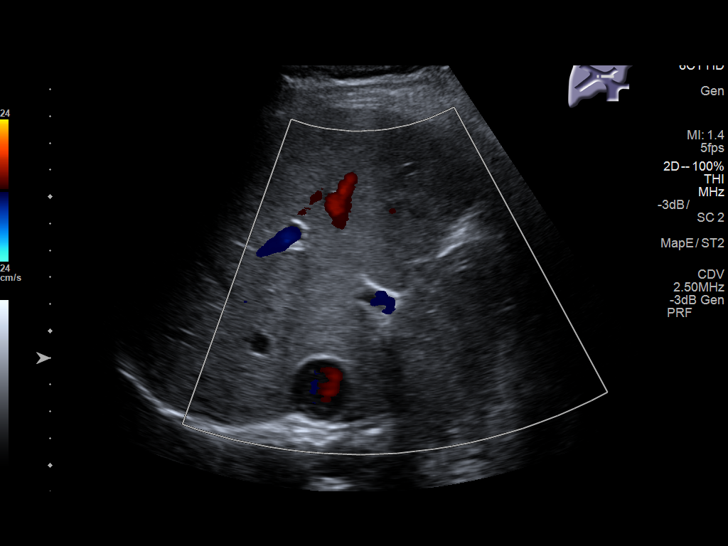
[im 42/51]
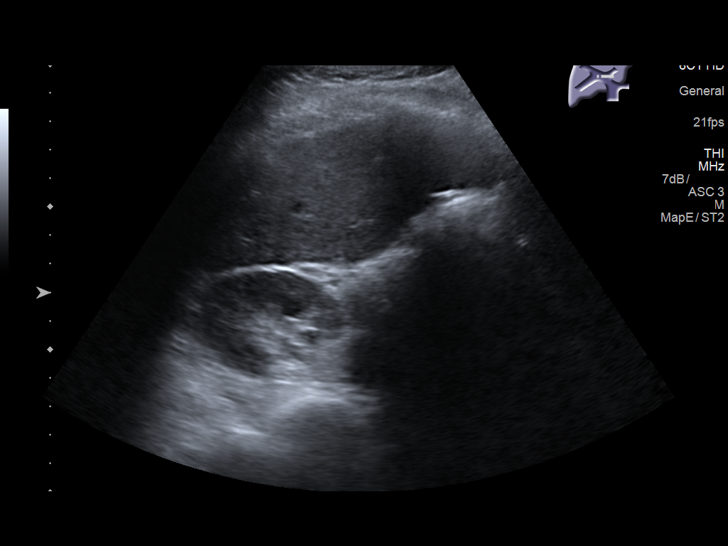
[im 46/51]
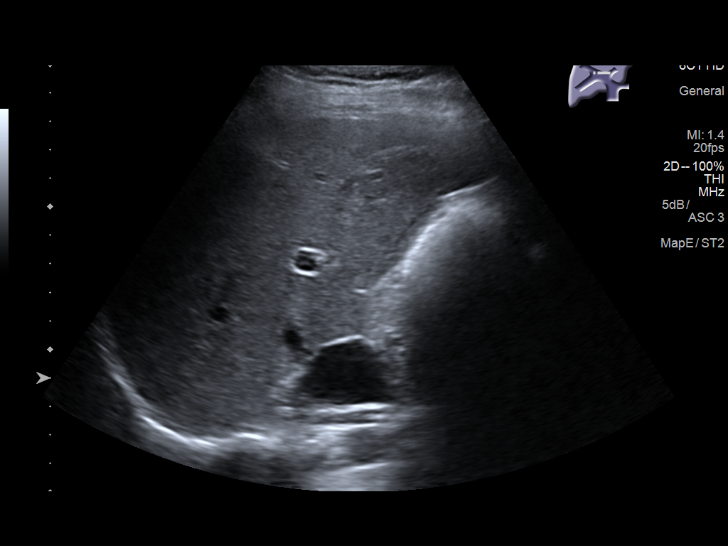
[im 51/51]
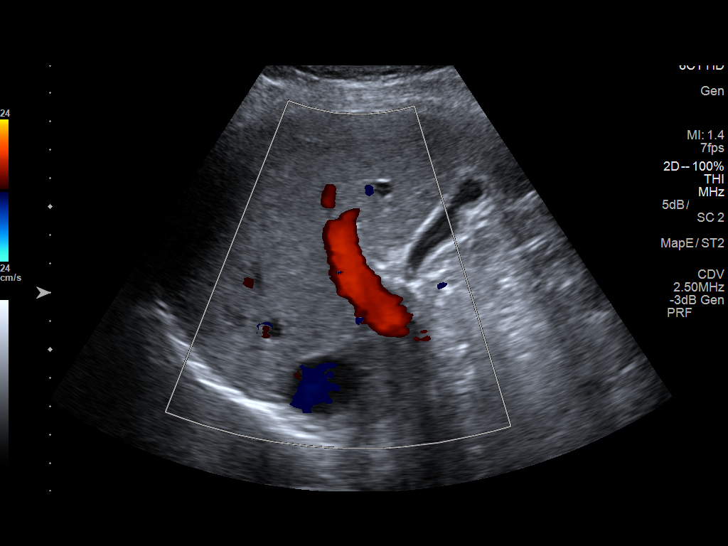

[14 of 25 positions shown; findings below may reference images not displayed]

FINDINGS: Gallbladder:

Gallbladder is partially contracted and due to a postprandial state.
Small gallstones are noted without complicating factors.

Common bile duct:

Diameter: 2 mm.

Liver:

No focal lesion identified. Within normal limits in parenchymal
echogenicity. Portal vein is patent on color Doppler imaging with
normal direction of blood flow towards the liver.
IMPRESSION: Cholelithiasis without acute abnormality.

## 2019-07-20 ENCOUNTER — Ambulatory Visit (HOSPITAL_COMMUNITY)
Admission: EM | Admit: 2019-07-20 | Discharge: 2019-07-20 | Disposition: A | Discharge status: Home / Self Care | Payer: Self-pay | Attending: Family Medicine | Admitting: Family Medicine

## 2019-07-20 ENCOUNTER — Other Ambulatory Visit: Payer: Self-pay

## 2019-07-20 ENCOUNTER — Encounter (HOSPITAL_COMMUNITY): Payer: Self-pay | Admitting: Emergency Medicine

## 2019-07-20 DIAGNOSIS — K047 Periapical abscess without sinus: Secondary | ICD-10-CM

## 2019-07-20 DIAGNOSIS — K0889 Other specified disorders of teeth and supporting structures: Secondary | ICD-10-CM

## 2019-07-20 MED ORDER — PENICILLIN V POTASSIUM 500 MG PO TABS: 500.0000 mg | ORAL_TABLET | Freq: Four times a day (QID) | ORAL | 0 refills | Status: AC

## 2019-07-20 MED ORDER — HYDROCODONE-ACETAMINOPHEN 5-325 MG PO TABS: 1.0000 | ORAL_TABLET | Freq: Four times a day (QID) | ORAL | 0 refills | Status: AC | PRN

## 2019-07-20 MED ORDER — HYDROCODONE-ACETAMINOPHEN 5-325 MG PO TABS
1.0000 | ORAL_TABLET | Freq: Once | ORAL | Status: AC
  Administered 2019-07-20: 1 via ORAL

## 2019-07-20 MED ORDER — HYDROCODONE-ACETAMINOPHEN 5-325 MG PO TABS
ORAL_TABLET | ORAL | Status: AC
  Filled 2019-07-20: qty 1

## 2019-07-20 NOTE — Discharge Instructions (Addendum)
Take the antibiotic as directed Pain medication as needed Follow up with dentist

## 2019-07-20 NOTE — ED Triage Notes (Signed)
PT reports left lower jaw pain and swelling that started this morning.

## 2019-07-20 NOTE — ED Provider Notes (Signed)
[facility]    CSN: [id number] Arrival date & time: 07/20/19  1651      History   Chief Complaint Chief Complaint  Patient presents with  . Dental Pain    HPI Brenda Hale is a 24 y.o. female.   HPI   Per nurse note Jaw pain and swelling since this morning Has a broken tooth No fever or chills No sore throat   Past Medical History:  Diagnosis Date  . Chronic headaches   . Tonsillitis     Patient Active Problem List   Diagnosis Date Noted  . Pelvic pain in female 07/18/2016  . Menorrhagia 07/18/2016  . 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 Tobacco abuse 03/15/2016  . Alcohol abuse 03/15/2016    Past Surgical History:  Procedure Laterality Date  . WISDOM TOOTH EXTRACTION      OB History    Gravida  1   Para      Term      Preterm      AB  1   Living  0     SAB      TAB      Ectopic      Multiple      Live Births               Home Medications    Prior to Admission medications   Medication Sig Start Date End Date Taking? Authorizing Provider  HYDROcodone-acetaminophen (NORCO/VICODIN) 5-325 MG tablet Take 1-2 tablets by mouth every 6 (six) hours as needed. 07/20/19   Eustace Moore, MD  penicillin v potassium (VEETID) 500 MG tablet Take 1 tablet (500 mg total) by mouth 4 (four) times daily for 10 days. 07/20/19 07/30/19  Eustace Moore, MD  sucralfate (CARAFATE) 1 g tablet Take 1 tablet (1 g total) by mouth 4 (four) times daily -  with meals and at bedtime. 05/07/17 07/20/19  Maxwell Caul, PA-C    Family History Family History  Problem Relation Age of Onset  . Cancer Mother   . Depression Mother   . Drug abuse Mother   . Miscarriages / [place] Mother   . Early death Father   . Heart disease Maternal Grandmother   . Hypertension Maternal Grandmother   . Miscarriages / Stillbirths Maternal Grandmother     Social History Social History   Tobacco Use  . Smoking status: Current Every Day  Smoker    Packs/day: 0.50    Types: Cigarettes  . Smokeless tobacco: Never Used  Substance Use Topics  . Alcohol use: Yes    Comment: occ  . Drug use: Yes    Types: Marijuana    Comment: occ     Allergies   Grapeseed extract [nutritional supplements]   Review of Systems Review of Systems  HENT: Positive for dental problem.      Physical Exam Triage Vital Signs ED Triage Vitals  Enc Vitals Group     BP 07/20/19 1658 128/64     Pulse Rate 07/20/19 1658 84     Resp 07/20/19 1658 16     Temp 07/20/19 1658 98.1 F (36.7 C)     Temp Source 07/20/19 1658 Oral     SpO2 07/20/19 1658 100 %     Weight --      Height --      Head Circumference --      Peak Flow --      Pain Score 07/20/19 1659 8  Pain Loc --      Pain Edu? --      Excl. in [place]? --    No data found.  Updated Vital Signs BP 128/64 (BP Location: Left Arm)   Pulse 84   Temp 98.1 F (36.7 C) (Oral)   Resp 16   LMP 07/11/2019   SpO2 100%        Physical Exam Constitutional:      General: She is not in acute distress.    Appearance: She is well-developed.  HENT:     Head: Normocephalic and atraumatic.      Nose: Nose normal.     Mouth/Throat:     Mouth: Mucous membranes are moist.     Pharynx: No posterior oropharyngeal erythema.   Eyes:     Conjunctiva/sclera: Conjunctivae normal.     Pupils: Pupils are equal, round, and reactive to light.  Cardiovascular:     Rate and Rhythm: Normal rate.  Pulmonary:     Effort: Pulmonary effort is normal. No respiratory distress.  Musculoskeletal:        General: Normal range of motion.     Cervical back: Normal range of motion.  Lymphadenopathy:     Cervical: No cervical adenopathy.  Skin:    General: Skin is warm and dry.  Neurological:     Mental Status: She is alert.      UC Treatments / Results  Labs (all labs ordered are listed, but only abnormal results are displayed) Labs Reviewed - No data to display  EKG   Radiology No  results found.  Procedures Procedures (including critical care time)  Medications Ordered in UC Medications  HYDROcodone-acetaminophen (NORCO/VICODIN) 5-325 MG per tablet 1 tablet (1 tablet Oral Given 07/20/19 1728)    Initial Impression / Assessment and Plan / UC Course  I have reviewed the triage vital signs and the nursing notes.  Pertinent labs & imaging results that were available during my care of the patient were reviewed by me and considered in my medical decision making (see chart for details).     Emphasized importance of dental follow up Final Clinical Impressions(s) / UC Diagnoses   Final diagnoses:  Dental infection  Pain, dental     Discharge Instructions     Take the antibiotic as directed Pain medication as needed Follow up with dentist   ED Prescriptions    Medication Sig Dispense Auth. Provider   penicillin v potassium (VEETID) 500 MG tablet Take 1 tablet (500 mg total) by mouth 4 (four) times daily for 10 days. 40 tablet Raylene Everts, MD   HYDROcodone-acetaminophen (NORCO/VICODIN) 5-325 MG tablet Take 1-2 tablets by mouth every 6 (six) hours as needed. 10 tablet Raylene Everts, MD     I have reviewed the PDMP during this encounter.   Raylene Everts, MD 07/20/19 [phone]

## 2019-08-01 ENCOUNTER — Encounter (HOSPITAL_COMMUNITY): Payer: Self-pay | Admitting: Emergency Medicine

## 2019-08-01 ENCOUNTER — Emergency Department (HOSPITAL_COMMUNITY)
Admission: EM | Admit: 2019-08-01 | Discharge: 2019-08-01 | Disposition: A | Discharge status: Home / Self Care | Payer: Medicaid Other | Attending: Emergency Medicine | Admitting: Emergency Medicine

## 2019-08-01 DIAGNOSIS — M545 Low back pain, unspecified: Secondary | ICD-10-CM

## 2019-08-01 DIAGNOSIS — S0990XA Unspecified injury of head, initial encounter: Secondary | ICD-10-CM

## 2019-08-01 DIAGNOSIS — S00511A Abrasion of lip, initial encounter: Secondary | ICD-10-CM | POA: Insufficient documentation

## 2019-08-01 DIAGNOSIS — Y92009 Unspecified place in unspecified non-institutional (private) residence as the place of occurrence of the external cause: Secondary | ICD-10-CM | POA: Insufficient documentation

## 2019-08-01 DIAGNOSIS — Y939 Activity, unspecified: Secondary | ICD-10-CM | POA: Insufficient documentation

## 2019-08-01 DIAGNOSIS — S0083XA Contusion of other part of head, initial encounter: Secondary | ICD-10-CM | POA: Insufficient documentation

## 2019-08-01 DIAGNOSIS — F17210 Nicotine dependence, cigarettes, uncomplicated: Secondary | ICD-10-CM | POA: Insufficient documentation

## 2019-08-01 DIAGNOSIS — G8929 Other chronic pain: Secondary | ICD-10-CM | POA: Insufficient documentation

## 2019-08-01 DIAGNOSIS — Y999 Unspecified external cause status: Secondary | ICD-10-CM | POA: Insufficient documentation

## 2019-08-01 HISTORY — DX: Dorsalgia, unspecified: M54.9

## 2019-08-01 LAB — POC URINE PREG, ED: Preg Test, Ur: NEGATIVE

## 2019-08-01 MED ORDER — OXYCODONE-ACETAMINOPHEN 5-325 MG PO TABS
1.0000 | ORAL_TABLET | Freq: Once | ORAL | Status: DC
  Filled 2019-08-01: qty 1

## 2019-08-01 MED ORDER — LIDOCAINE 5 % EX PTCH: 1.0000 | MEDICATED_PATCH | CUTANEOUS | 0 refills | Status: AC

## 2019-08-01 MED ORDER — KETOROLAC TROMETHAMINE 15 MG/ML IJ SOLN
15.0000 mg | Freq: Once | INTRAMUSCULAR | Status: DC
  Filled 2019-08-01: qty 1

## 2019-08-01 MED ORDER — TETANUS-DIPHTH-ACELL PERTUSSIS 5-2.5-18.5 LF-MCG/0.5 IM SUSP
0.5000 mL | Freq: Once | INTRAMUSCULAR | Status: AC
  Administered 2019-08-01: 0.5 mL via INTRAMUSCULAR
  Filled 2019-08-01: qty 0.5

## 2019-08-01 MED ORDER — METHOCARBAMOL 500 MG PO TABS: 500.0000 mg | ORAL_TABLET | Freq: Three times a day (TID) | ORAL | 0 refills | Status: AC
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
## 2019-08-01 NOTE — Discharge Instructions (Addendum)
At this time there does not appear to be the presence of an emergent medical condition, however there is always the potential for conditions to change. Please read and follow the below instructions.  Please return to the Emergency Department immediately for any new or worsening symptoms. Please be sure to follow up with your Primary Care Provider within one week regarding your visit today; please call their office to schedule an appointment even if you are feeling better for a follow-up visit. You have been given an NSAID-containing medication called Toradol today.  Do not take the medications including ibuprofen, Aleve, Advil, naproxen or other NSAID-containing medications for the next 2 days.  Please be sure to drink plenty of water over the next few days. You may use the muscle relaxer Robaxin as prescribed to help with your symptoms.  Do not drive or operate heavy machinery while taking Robaxin as it will make you drowsy.  Do not drink alcohol or take other sedating medications while taking Robaxin as this will worsen side effects. You may use the pain medication Percocet (Oxycodone/Acetaminophen) as prescribed for severe pain.  Percocet will make you drowsy so do not drive, drink alcohol, take other sedating medications or perform any dangerous activities such as driving after taking Percocet.  Percocet contains Tylenol (acetaminophen) so do not take any other medications containing Tylenol with Percocet. You may use the Lidoderm patch as prescribed to help with your symptoms.  Lidoderm may be expensive so you may speak with your pharmacist about finding over-the-counter medications that work similarly.  Get help right away if: You develop new bowel or bladder control problems. You have unusual weakness or numbness in your arms or legs. You develop nausea or vomiting. You have burning when you pee or blood in your urine You develop abdominal pain. You feel faint. You have: A very bad headache  that is not helped by medicine. Trouble walking or weakness in your arms and legs. Clear or bloody fluid coming from your nose or ears. Changes in how you see (vision). Shaking movements that you cannot control. You lose your balance. You vomit. The black centers of your eyes (pupils) change in size. Your speech is slurred. Your dizziness gets worse. You pass out. You are sleepier than normal and have trouble staying awake. You have any new/concerning or worsening of symptoms   Please read the additional information packets attached to your discharge summary.  Do not take your medicine if  develop an itchy rash, swelling in your mouth or lips, or difficulty breathing; call 911 and seek immediate emergency medical attention if this occurs.  Note: Portions of this text may have been transcribed using voice recognition software. Every effort was made to ensure accuracy; however, inadvertent computerized transcription errors may still be present.

## 2019-08-01 NOTE — ED Notes (Signed)
An After Visit Summary was printed and given to the patient. Discharge instructions given and no further questions at this time.  
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
## 2019-08-14 IMAGING — DX DG CHEST 2V
2 series · 2 of 2 positions shown · non-contrast
Comparison: None.

CLINICAL DATA: 21-year-old female with chest pain.

EXAM:
CHEST - 2 VIEW

[chest pa]
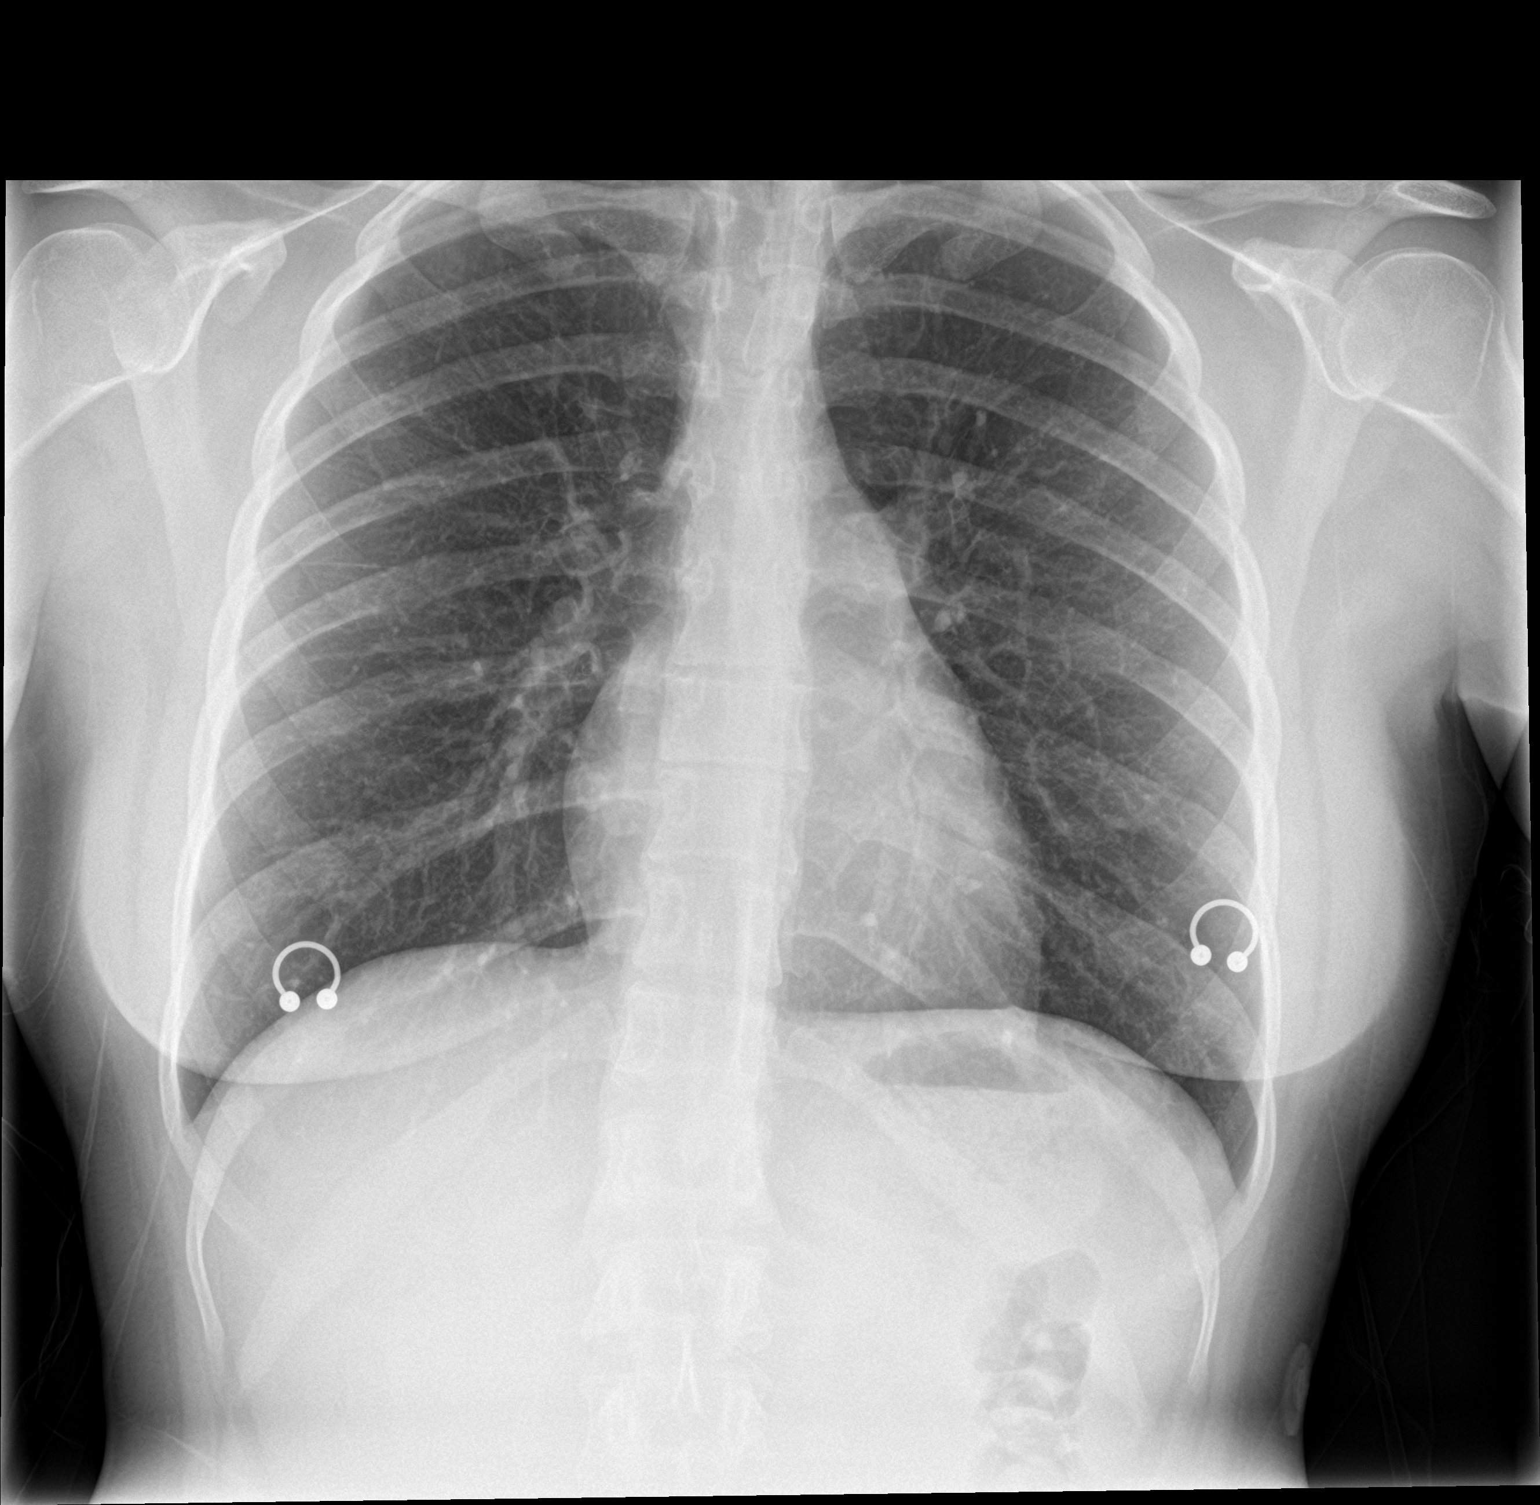

[chest lat]
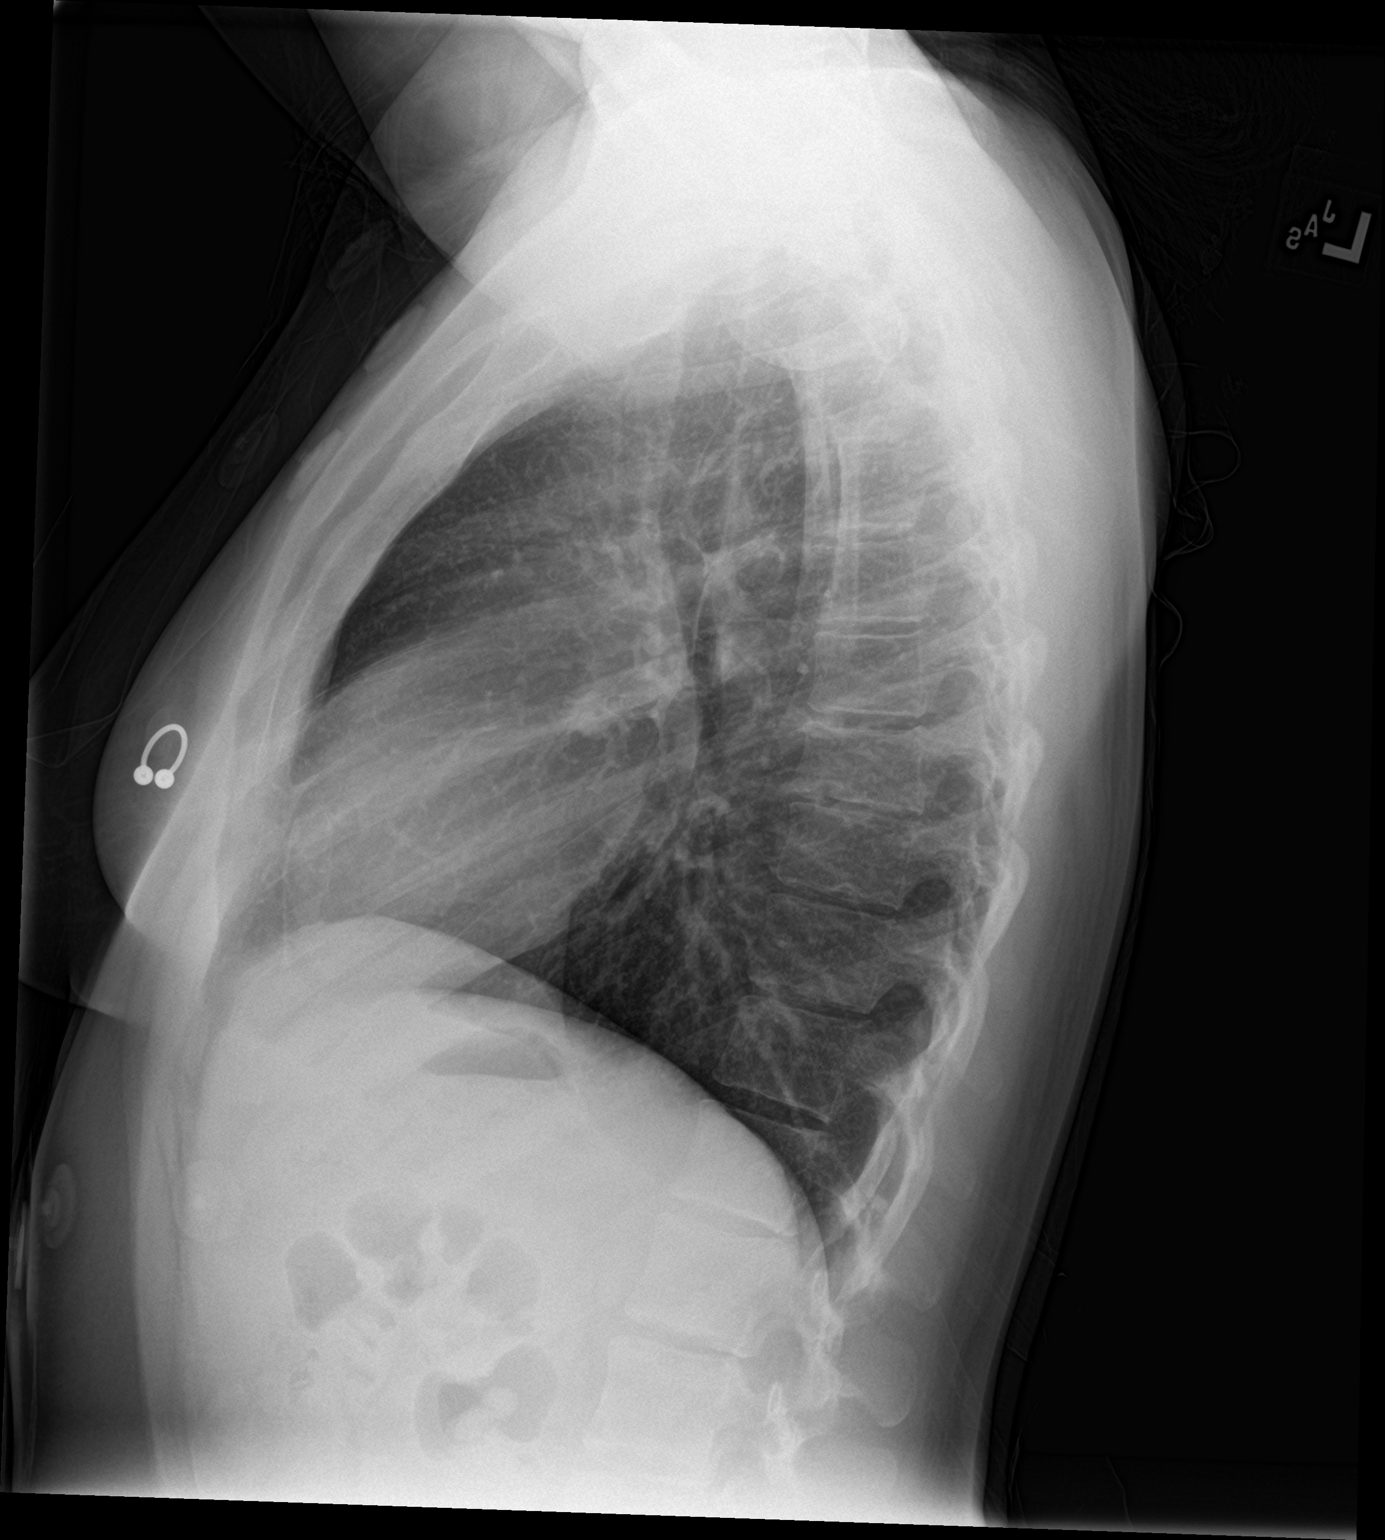

[2 of 2 positions shown; findings below may reference images not displayed]

FINDINGS: The heart size and mediastinal contours are within normal limits.
Both lungs are clear. The visualized skeletal structures are
unremarkable.
IMPRESSION: No active cardiopulmonary disease.

## 2019-08-15 IMAGING — CT CT ABD-PELV W/ CM
4 of 11 series · 13 of 46 positions shown, 17 images · IV contrast (APPLIED)
Comparison: 05/06/2017 right upper quadrant ultrasound.

CLINICAL DATA: 21 y/o F; chest tightness, shortness of breath, and
right upper quadrant/epigastric abdominal pain.

EXAM:
CT ANGIOGRAPHY CHEST
CT ABDOMEN AND PELVIS WITH CONTRAST
TECHNIQUE: Multidetector CT imaging of the chest was performed using the
standard protocol during bolus administration of intravenous
contrast. Multiplanar CT image reconstructions and MIPs were
obtained to evaluate the vascular anatomy. Multidetector CT imaging
of the abdomen and pelvis was performed using the standard protocol
during bolus administration of intravenous contrast.
CONTRAST:  100mL 12M9K5-[facility] IOPAMIDOL (12M9K5-[facility]) INJECTION 76%

[Series 5: arterial · axial · arterial · 0.59mm/px · z∈[-140,-94]mm · 2 of 115 slices shown]
[im 23/115  soft-tissue]
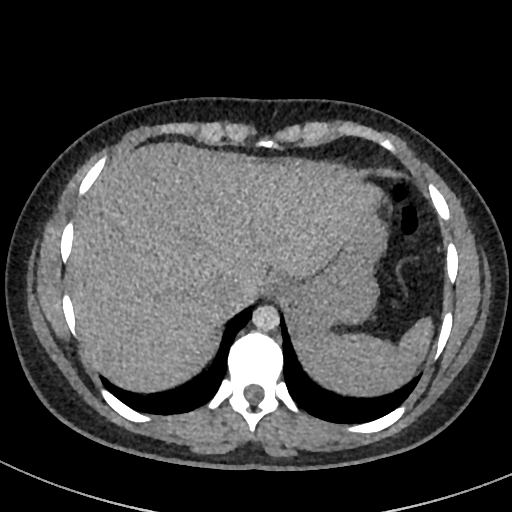
[im 46/115  soft-tissue]
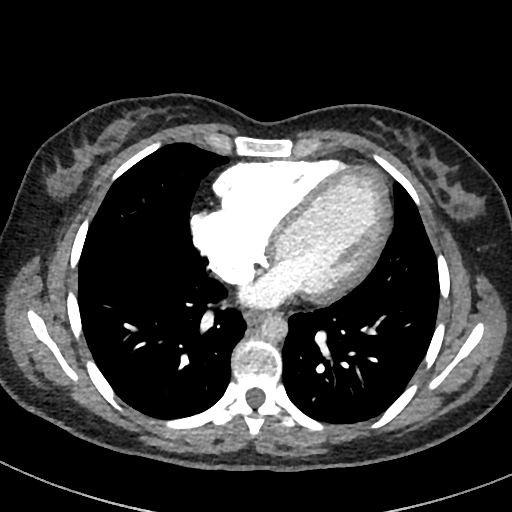

[Series 6: thins · axial · 0.59mm/px · z∈[-168,+28]mm · 8 of 328 slices shown]
[im 24/328  soft-tissue]
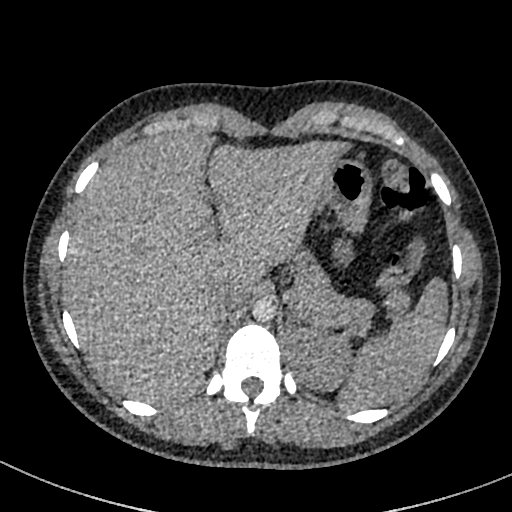
[im 71/328  soft-tissue]
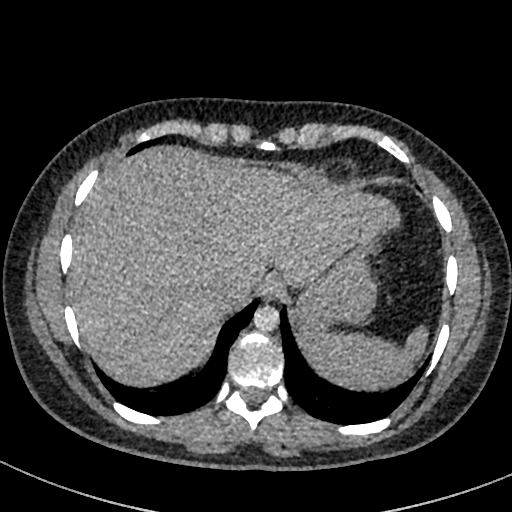
[im 117/328  soft-tissue]
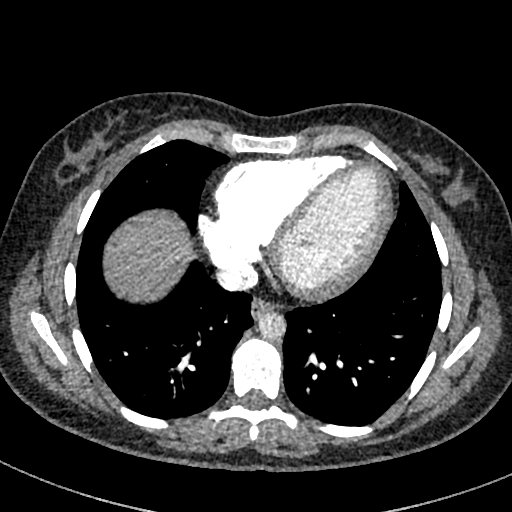
[im 141/328  soft-tissue]
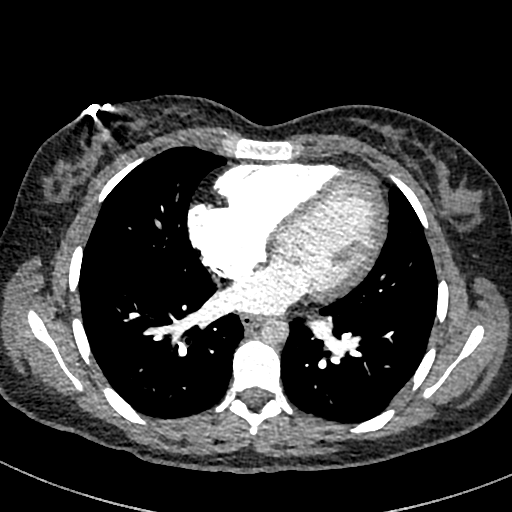
[im 187/328  soft-tissue]
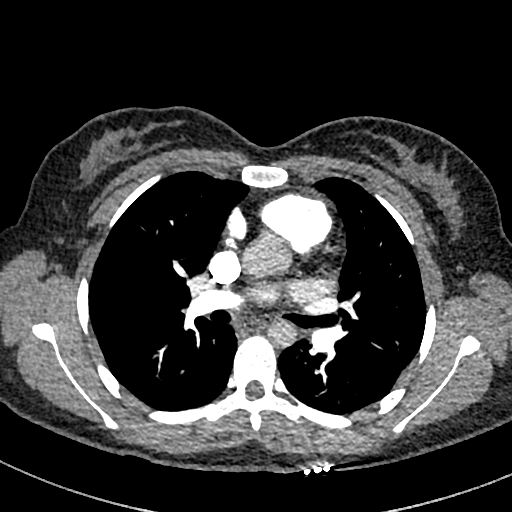
[im 211/328  soft-tissue]
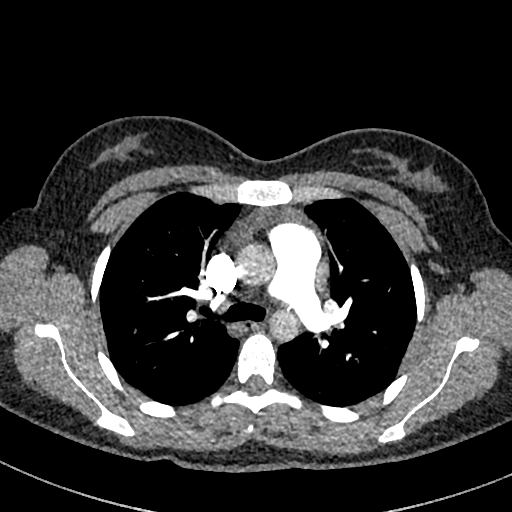
[im 257/328  soft-tissue]
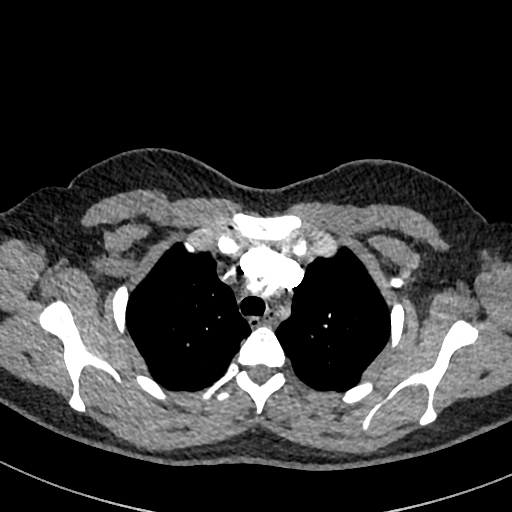
[im 304/328  soft-tissue]
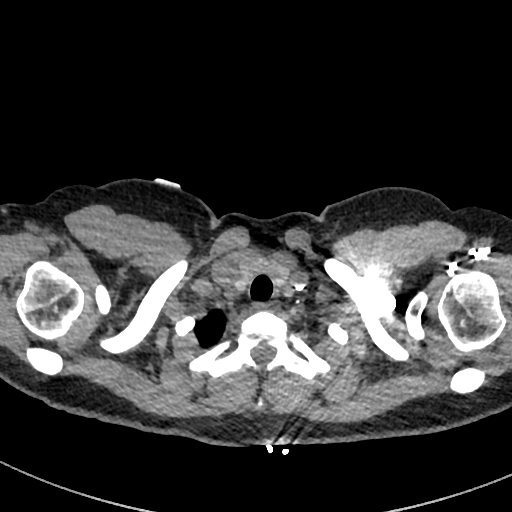

[Series 8: cor · coronal · 0.44mm/px · 1 of 95 slices shown, 2 images]
[im 48/95  soft-tissue]
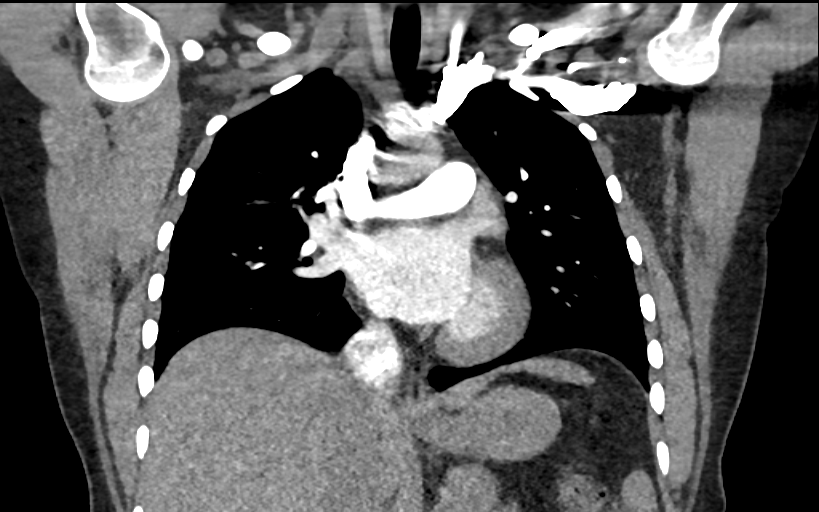
[im 48/95  bone]
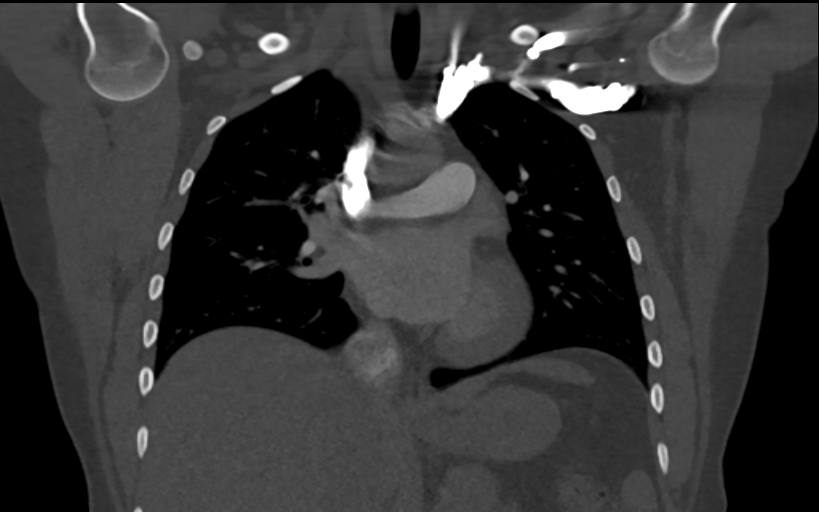

[Series 12: abdomen 5.0 · axial · 0.60mm/px · z∈[-389,-239]mm · 2 of 90 slices shown, 5 images]
[im 30/90  soft-tissue]
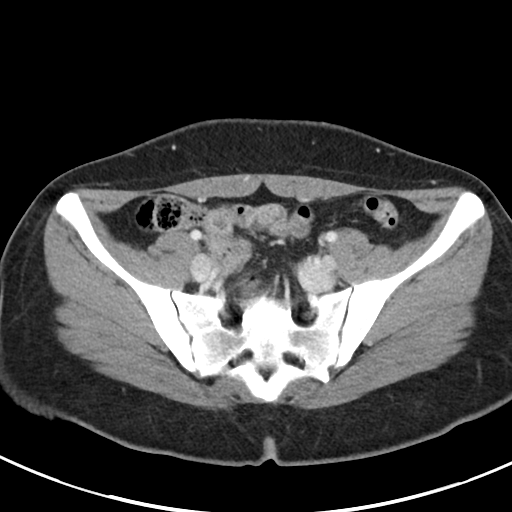
[im 30/90  lung]
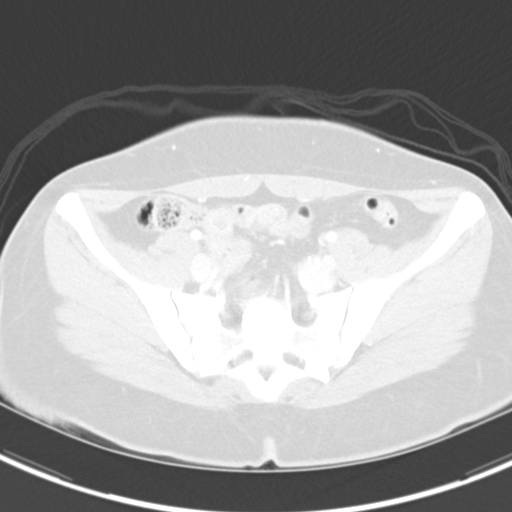
[im 30/90  bone]
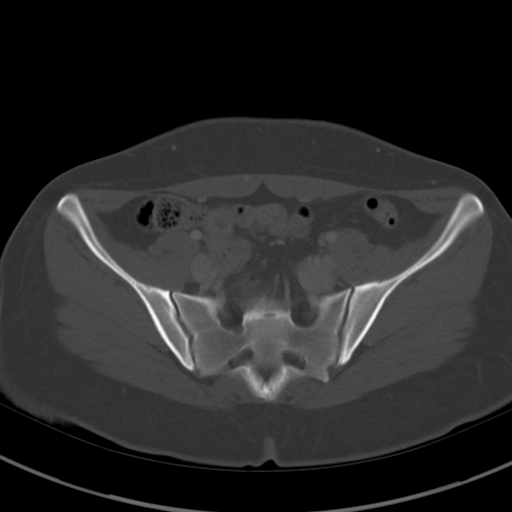
[im 60/90  soft-tissue]
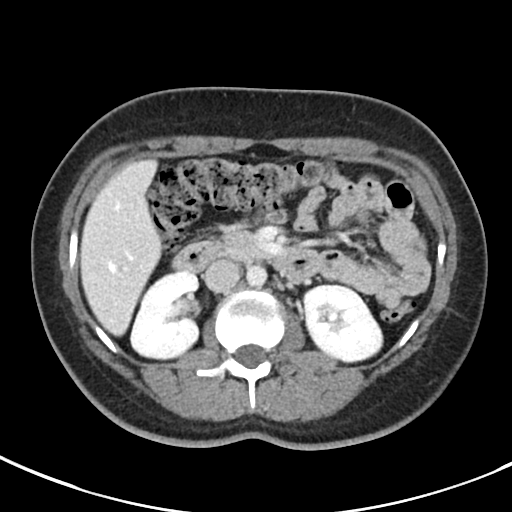
[im 60/90  lung]
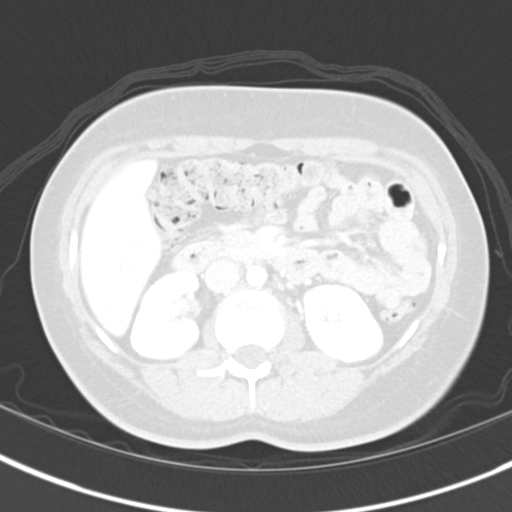

[13 of 46 positions shown; findings below may reference images not displayed]

FINDINGS: CTA CHEST FINDINGS

Cardiovascular: Satisfactory opacification of the pulmonary arteries
to the segmental level. No evidence of pulmonary embolism. Normal
heart size. No pericardial effusion.

Mediastinum/Nodes: No enlarged mediastinal, hilar, or axillary lymph
nodes. Thyroid gland, trachea, and esophagus demonstrate no
significant findings.

Lungs/Pleura: Lungs are clear. No pleural effusion or pneumothorax.

Musculoskeletal: No chest wall abnormality. No acute or significant
osseous findings.

Review of the MIP images confirms the above findings.

CT ABDOMEN and PELVIS FINDINGS

Hepatobiliary: No focal liver abnormality is seen. No gallstones,
gallbladder wall thickening, or biliary dilatation.

Pancreas: Unremarkable. No pancreatic ductal dilatation or
surrounding inflammatory changes.

Spleen: Normal in size without focal abnormality. 15 mm splenule
within the hilum.

Adrenals/Urinary Tract: Adrenal glands are unremarkable. Kidneys are
normal, without renal calculi, focal lesion, or hydronephrosis.
Bladder is unremarkable.

Stomach/Bowel: Stomach is within normal limits. Appendix appears
normal. No evidence of bowel wall thickening, distention, or
inflammatory changes.

Vascular/Lymphatic: No significant vascular findings are present. No
enlarged abdominal or pelvic lymph nodes.

Reproductive: Simple appearing 2.3 cm left adnexal cyst. 1.8 cm cyst
with thick rim of enhancement, likely corpus luteum in left adnexa.
Small volume of low-attenuation fluid within pelvis and right lower
quadrant.

Other: No abdominal wall hernia or abnormality. No abdominopelvic
ascites.

Musculoskeletal: No acute or significant osseous findings.

Review of the MIP images confirms the above findings.
IMPRESSION: 1. Normal CT of the chest.  No pulmonary embolus identified.
2. Small volume of simple appearing fluid within the pelvis, more
than expected for physiologic, possibly adnexal cyst rupture.
3. Otherwise unremarkable CT of abdomen and pelvis.

By: Upieck Mardiani M.D.
